# Patient Record
Sex: Female | Born: 1975 | Race: White | Hispanic: No | Marital: Single | Smoking: Never smoker
Health system: Southern US, Community
[De-identification: ages and names within clinical notes are randomized; demographics above are authoritative.]

## PROBLEM LIST (undated history)

## (undated) DIAGNOSIS — M5430 Sciatica, unspecified side: Secondary | ICD-10-CM

## (undated) DIAGNOSIS — Q828 Other specified congenital malformations of skin: Secondary | ICD-10-CM

## (undated) DIAGNOSIS — K589 Irritable bowel syndrome without diarrhea: Secondary | ICD-10-CM

## (undated) DIAGNOSIS — N760 Acute vaginitis: Secondary | ICD-10-CM

## (undated) DIAGNOSIS — F419 Anxiety disorder, unspecified: Secondary | ICD-10-CM

## (undated) DIAGNOSIS — F411 Generalized anxiety disorder: Secondary | ICD-10-CM

## (undated) DIAGNOSIS — Z90710 Acquired absence of both cervix and uterus: Secondary | ICD-10-CM

## (undated) DIAGNOSIS — E063 Autoimmune thyroiditis: Secondary | ICD-10-CM

## (undated) DIAGNOSIS — R0789 Other chest pain: Secondary | ICD-10-CM

## (undated) DIAGNOSIS — R1031 Right lower quadrant pain: Secondary | ICD-10-CM

## (undated) DIAGNOSIS — J42 Unspecified chronic bronchitis: Secondary | ICD-10-CM

## (undated) DIAGNOSIS — J329 Chronic sinusitis, unspecified: Secondary | ICD-10-CM

## (undated) DIAGNOSIS — Z8719 Personal history of other diseases of the digestive system: Secondary | ICD-10-CM

## (undated) HISTORY — DX: Sciatica, unspecified side: M54.30

## (undated) HISTORY — DX: Chronic sinusitis, unspecified: J32.9

## (undated) HISTORY — DX: Right lower quadrant pain: R10.31

## (undated) HISTORY — DX: Other chest pain: R07.89

## (undated) HISTORY — DX: Autoimmune thyroiditis: E06.3

## (undated) HISTORY — DX: Other specified congenital malformations of skin: Q82.8

## (undated) HISTORY — DX: Anxiety disorder, unspecified: F41.9

## (undated) HISTORY — DX: Unspecified chronic bronchitis: J42

## (undated) HISTORY — DX: Irritable bowel syndrome, unspecified: K58.9

## (undated) HISTORY — DX: Acute vaginitis: N76.0

## (undated) HISTORY — PX: WISDOM TOOTH EXTRACTION: SHX21

## (undated) HISTORY — PX: ABDOMINAL HYSTERECTOMY: SHX81

## (undated) HISTORY — DX: Generalized anxiety disorder: F41.1

---

## 2009-04-03 ENCOUNTER — Ambulatory Visit: Payer: Self-pay | Admitting: Internal Medicine

## 2012-04-06 ENCOUNTER — Ambulatory Visit: Payer: Self-pay | Admitting: Emergency Medicine

## 2012-04-09 ENCOUNTER — Ambulatory Visit: Payer: Self-pay

## 2012-04-09 LAB — RAPID STREP-A WITH REFLX: Micro Text Report: NEGATIVE

## 2012-09-14 ENCOUNTER — Ambulatory Visit: Payer: Self-pay

## 2012-10-26 ENCOUNTER — Ambulatory Visit: Payer: Self-pay | Admitting: Internal Medicine

## 2012-11-18 ENCOUNTER — Ambulatory Visit: Payer: Self-pay | Admitting: Family Medicine

## 2013-01-11 ENCOUNTER — Ambulatory Visit: Payer: Self-pay | Admitting: Family Medicine

## 2013-01-13 ENCOUNTER — Encounter: Payer: Self-pay | Admitting: *Deleted

## 2013-02-02 ENCOUNTER — Encounter: Payer: Self-pay | Admitting: Cardiovascular Disease

## 2013-02-02 ENCOUNTER — Ambulatory Visit (INDEPENDENT_AMBULATORY_CARE_PROVIDER_SITE_OTHER): Payer: PRIVATE HEALTH INSURANCE | Admitting: Cardiovascular Disease

## 2013-02-02 VITALS — BP 120/90 | HR 84 | Ht 63.0 in | Wt 138.2 lb

## 2013-02-02 DIAGNOSIS — R9431 Abnormal electrocardiogram [ECG] [EKG]: Secondary | ICD-10-CM

## 2013-02-02 DIAGNOSIS — R079 Chest pain, unspecified: Secondary | ICD-10-CM | POA: Insufficient documentation

## 2013-02-02 NOTE — Assessment & Plan Note (Signed)
The patient's chest pain resolved after the dose of Xanax was increased. Her symptoms are atypical and nonexertional. She has no significant risk factors for coronary artery disease except for an unspecified family history. Her chance of underlying obstructive coronary artery disease is extremely low. Her EKG is unremarkable. I think the etiology of stress testing in this situation is extremely low given that the pretest probability for underlying CAD is overall very low.  Her physical exam is unremarkable and she has no cardiac murmurs or other abnormalities. Thus, I asked her to followup with [place] as needed.

## 2013-02-02 NOTE — Patient Instructions (Addendum)
Follow up as needed

## 2013-02-02 NOTE — Progress Notes (Signed)
HPI  This is a pleasant 37 year old Caucasian female who was referred by Dr. Manuella Ghazi for evaluation of chest pain. She has no previous cardiac history and no significant chronic medical conditions. She is a lifelong nonsmoker. She has a family history of  premature coronary artery disease. In December of last year, she had the flu. In January, she had bronchitis and was treated with antibiotics. She developed substernal chest achiness and pressure feeling at that time. Her symptoms were mostly at rest. She had an EKG chest x-ray and labs. All of them were unremarkable. She also underwent CTA of the chest which showed no evidence of pulmonary embolism. She is known to have anxiety and has been on Xanax. The dose was increased recently for that reason. She reports no chest pain over the last week and she feels back to her normal self. She is able to do all activities without any exertional symptoms. She actually feels better when she exercises.  Allergies  Allergen Reactions  . Bee Venom   . Ciprocinonide (Fluocinolone)   . Vicodin (Hydrocodone-Acetaminophen)      Current Outpatient Prescriptions on File Prior to Visit  Medication Sig Dispense Refill  . ALPRAZolam (XANAX) 0.5 MG tablet Take 0.5 mg by mouth 3 (three) times daily as needed for sleep.      . norethindrone-ethinyl estradiol-iron (MICROGESTIN FE,GILDESS FE,LOESTRIN FE) 1.5-30 MG-MCG tablet Take 1 tablet by mouth daily.       No current facility-administered medications on file prior to visit.     Past Medical History  Diagnosis Date  . Irritable bowel syndrome   . Anxiety state, unspecified   . Vaginitis and vulvovaginitis, unspecified   . Other chest pain   . Other specified congenital anomaly of skin   . Chronic lymphocytic thyroiditis   . Unspecified chronic bronchitis   . Unspecified sinusitis (chronic)   . Sciatica      History reviewed. No pertinent past surgical history.   Family History  Problem Relation Age  of Onset  . Hypertension Father   . Heart disease Father   . Heart attack Paternal Grandmother      History   Social History  . Marital Status: Single    Spouse Name: N/A    Number of Children: N/A  . Years of Education: N/A   Occupational History  . Not on file.   Social History Main Topics  . Smoking status: Never Smoker   . Smokeless tobacco: Not on file  . Alcohol Use: No  . Drug Use: No  . Sexually Active: Not on file   Other Topics Concern  . Not on file   Social History Narrative  . No narrative on file     ROS Constitutional: Negative for fever, chills, diaphoresis, activity change, appetite change and fatigue.  HENT: Negative for hearing loss, nosebleeds, congestion, sore throat, facial swelling, drooling, trouble swallowing, neck pain, voice change, sinus pressure and tinnitus.  Eyes: Negative for photophobia, pain, discharge and visual disturbance.  Respiratory: Negative for apnea, cough and wheezing.  Cardiovascular: Negative for  palpitations and leg swelling.  Gastrointestinal: Negative for nausea, vomiting, abdominal pain, diarrhea, constipation, blood in stool and abdominal distention.  Genitourinary: Negative for dysuria, urgency, frequency, hematuria and decreased urine volume.  Musculoskeletal: Negative for myalgias, back pain, joint swelling, arthralgias and gait problem.  Skin: Negative for color change, pallor, rash and wound.  Neurological: Negative for dizziness, tremors, seizures, syncope, speech difficulty, weakness, light-headedness, numbness and headaches.  Psychiatric/Behavioral: Negative for suicidal ideas, hallucinations, behavioral problems and agitation. The patient is not nervous/anxious.     PHYSICAL EXAM   BP 120/90  Pulse 84  Ht 5' 3"  (1.6 m)  Wt 138 lb 4 oz (62.71 kg)  BMI 24.5 kg/m2 Constitutional: She is oriented to person, place, and time. She appears well-developed and well-nourished. No distress.  HENT: No nasal  discharge.  Head: Normocephalic and atraumatic.  Eyes: Pupils are equal and round. Right eye exhibits no discharge. Left eye exhibits no discharge.  Neck: Normal range of motion. Neck supple. No JVD present. No thyromegaly present.  Cardiovascular: Normal rate, regular rhythm, normal heart sounds. Exam reveals no gallop and no friction rub. No murmur heard.  Pulmonary/Chest: Effort normal and breath sounds normal. No stridor. No respiratory distress. She has no wheezes. She has no rales. She exhibits no tenderness.  Abdominal: Soft. Bowel sounds are normal. She exhibits no distension. There is no tenderness. There is no rebound and no guarding.  Musculoskeletal: Normal range of motion. She exhibits no edema and no tenderness.  Neurological: She is alert and oriented to person, place, and time. Coordination normal.  Skin: Skin is warm and dry. No rash noted. She is not diaphoretic. No erythema. No pallor.  Psychiatric: She has a normal mood and affect. Her behavior is normal. Judgment and thought content normal.     EKG: Normal sinus rhythm with incomplete right bundle branch block which is a normal variant.   ASSESSMENT AND PLAN

## 2014-10-06 LAB — HM PAP SMEAR: HM PAP: NEGATIVE

## 2014-12-26 ENCOUNTER — Ambulatory Visit: Payer: Self-pay | Admitting: Family Medicine

## 2015-08-24 ENCOUNTER — Ambulatory Visit (INDEPENDENT_AMBULATORY_CARE_PROVIDER_SITE_OTHER): Payer: No Typology Code available for payment source | Admitting: Family Medicine

## 2015-08-24 ENCOUNTER — Encounter: Payer: Self-pay | Admitting: Family Medicine

## 2015-08-24 VITALS — BP 114/70 | HR 93 | Temp 98.3°F | Resp 16 | Ht 64.0 in | Wt 142.3 lb

## 2015-08-24 DIAGNOSIS — R1084 Generalized abdominal pain: Secondary | ICD-10-CM

## 2015-08-24 DIAGNOSIS — K589 Irritable bowel syndrome without diarrhea: Secondary | ICD-10-CM | POA: Diagnosis not present

## 2015-08-24 DIAGNOSIS — F411 Generalized anxiety disorder: Secondary | ICD-10-CM

## 2015-08-24 MED ORDER — LINACLOTIDE 290 MCG PO CAPS: 290.0000 ug | ORAL_CAPSULE | Freq: Every day | ORAL | Status: DC

## 2015-08-24 MED ORDER — OMEPRAZOLE 20 MG PO CPDR: 20.0000 mg | DELAYED_RELEASE_CAPSULE | Freq: Every day | ORAL | Status: DC

## 2015-08-24 NOTE — Patient Instructions (Signed)
Irritable Bowel Syndrome, Adult Irritable bowel syndrome (IBS) is not one specific disease. It is a group of symptoms that affects the organs responsible for digestion (gastrointestinal or GI tract).  To regulate how your GI tract works, your body sends signals back and forth between your intestines and your brain. If you have IBS, there may be a problem with these signals. As a result, your GI tract does not function normally. Your intestines may become more sensitive and overreact to certain things. This is especially true when you eat certain foods or when you are under stress.  There are four types of IBS. These may be determined based on the consistency of your stool:   IBS with diarrhea.   IBS with constipation.   Mixed IBS.   Unsubtyped IBS.  It is important to know which type of IBS you have. Some treatments are more likely to be helpful for certain types of IBS.  CAUSES  The exact cause of IBS is not known. RISK FACTORS You may have a higher risk of IBS if:  You are a woman.  You are younger than 41 years old.  You have a family history of IBS.  You have mental health problems.  You have had bacterial infection of your GI tract. SIGNS AND SYMPTOMS  Symptoms of IBS vary from person to person. The main symptom is abdominal pain or discomfort. Additional symptoms usually include one or more of the following:   Diarrhea, constipation, or both.   Abdominal swelling or bloating.   Feeling full or sick after eating a small or regular-size meal.   Frequent gas.   Mucus in the stool.   A feeling of having more stool left after a bowel movement.  Symptoms tend to come and go. They may be associated with stress, psychiatric conditions, or nothing at all.  DIAGNOSIS  There is no specific test to diagnose IBS. Your health care provider will make a diagnosis based on a physical exam, medical history, and your symptoms. You may have other tests to rule out other  conditions that may be causing your symptoms. These may include:   Blood tests.   X-rays.   CT scan.  Endoscopy and colonoscopy. This is a test in which your GI tract is viewed with a long, thin, flexible tube. TREATMENT There is no cure for IBS, but treatment can help relieve symptoms. IBS treatment often includes:   Changes to your diet, such as:  Eating more fiber.  Avoiding foods that cause symptoms.  Drinking more water.  Eating regular, medium-sized portioned meals.  Medicines. These may include:  Fiber supplements if you have constipation.  Medicine to control diarrhea (antidiarrheal medicines).  Medicine to help control muscle spasms in your GI tract (antispasmodic medicines).  Medicines to help with any mental health issues, such as antidepressants or tranquilizers.  Therapy.  Talk therapy may help with anxiety, depression, or other mental health issues that can make IBS symptoms worse.  Stress reduction.  Managing your stress can help keep symptoms under control. HOME CARE INSTRUCTIONS   Take medicines only as directed by your health care provider.  Eat a healthy diet.  Avoid foods and drinks with added sugar.  Include more whole grains, fruits, and vegetables gradually into your diet. This may be especially helpful if you have IBS with constipation.  Avoid any foods and drinks that make your symptoms worse. These may include dairy products and caffeinated or carbonated drinks.  Do not eat large meals.  Drink enough fluid to keep your urine clear or pale yellow.  Exercise regularly. Ask your health care provider for recommendations of good activities for you.  Keep all follow-up visits as directed by your health care provider. This is important. SEEK MEDICAL CARE IF:   You have constant pain.  You have trouble or pain with swallowing.  You have worsening diarrhea. SEEK IMMEDIATE MEDICAL CARE IF:   You have severe and worsening abdominal  pain.   You have diarrhea and:   You have a rash, stiff neck, or severe headache.   You are irritable, sleepy, or difficult to awaken.   You are weak, dizzy, or extremely thirsty.   You have bright red blood in your stool or you have black tarry stools.   You have unusual abdominal swelling that is painful.   You vomit continuously.   You vomit blood (hematemesis).   You have both abdominal pain and a fever.    This information is not intended to replace advice given to you by your health care provider. Make sure you discuss any questions you have with your health care provider.   Document Released: 10/21/2005 Document Revised: 11/11/2014 Document Reviewed: 07/08/2014 Elsevier Interactive Patient Education [employer].

## 2015-08-24 NOTE — Progress Notes (Signed)
Name: Betty Rodgers   MRN: [id number]    DOB: 1976/04/18   Date:08/24/2015       Progress Note  Subjective  Chief Complaint  Chief Complaint  Patient presents with  . Abdominal Pain    for 5 days, belching, bloated, gas    HPI  Irritable bowel syndrome  Patient presents with a five-day history of belching and bloating gas.  Bowel movements have been more frequent than in the past 3 per day. In of regular consistency. No fever chills weight loss and nausea vomiting and melena or hematochezia. It is of note the patient has had increasing stressors in her life including family issues as well as other STRESSORS. THERE IS NO OFFENDING FOOD. THIS BEEN NO FEVER OR CHILLS.  Anxiety  Patient has a long-standing history of anxiety. She's been on alprazolam on a daily basis for a long period of time  Past Medical History  Diagnosis Date  . Irritable bowel syndrome   . Anxiety state, unspecified   . Vaginitis and vulvovaginitis, unspecified   . Other chest pain   . Other specified congenital anomaly of skin   . Chronic lymphocytic thyroiditis   . Unspecified chronic bronchitis ([place])   . Unspecified sinusitis (chronic)   . Sciatica     Social History  Substance Use Topics  . Smoking status: Never Smoker   . Smokeless tobacco: Not on file  . Alcohol Use: No     Current outpatient prescriptions:  .  ALPRAZolam (XANAX) 0.5 MG tablet, Take 0.5 mg by mouth 3 (three) times daily as needed for sleep., Disp: , Rfl:  .  norethindrone-ethinyl estradiol-iron (MICROGESTIN FE,GILDESS FE,LOESTRIN FE) 1.5-30 MG-MCG tablet, Take 1 tablet by mouth daily., Disp: , Rfl:  .  ibuprofen (ADVIL,MOTRIN) 400 MG tablet, Take 400 mg by mouth every 6 (six) hours as needed for pain., Disp: , Rfl:   Allergies  Allergen Reactions  . Bee Venom   . Ciprocinonide [Fluocinolone]   . Vicodin [Hydrocodone-Acetaminophen]     Review of Systems  Constitutional: Negative for fever, chills and weight loss.   HENT: Negative for congestion, hearing loss, sore throat and tinnitus.   Eyes: Negative for blurred vision, double vision and redness.  Respiratory: Negative for cough, hemoptysis and shortness of breath.   Cardiovascular: Negative for chest pain, palpitations, orthopnea, claudication and leg swelling.  Gastrointestinal: Positive for heartburn and abdominal pain. Negative for nausea, vomiting, diarrhea, constipation and blood in stool.       Bowel movements are more frequent than usual but are normal normal in consistency  Genitourinary: Negative for dysuria, urgency, frequency and hematuria.  Musculoskeletal: Negative for myalgias, back pain, joint pain, falls and neck pain.  Skin: Negative for itching.  Neurological: Negative for dizziness, tingling, tremors, focal weakness, seizures, loss of consciousness, weakness and headaches.  Endo/Heme/Allergies: Does not bruise/bleed easily.  Psychiatric/Behavioral: Negative for depression and substance abuse. The patient is not nervous/anxious and does not have insomnia.      Objective  Filed Vitals:   08/24/15 0911  BP: 114/70  Pulse: 93  Temp: 98.3 F (36.8 C)  TempSrc: Oral  Resp: 16  Height: 5' 4" (1.626 m)  Weight: 142 lb 4.8 oz (64.547 kg)  SpO2: 97%     Physical Exam  Constitutional: She is oriented to person, place, and time and well-developed, well-nourished, and in no distress.  HENT:  Head: Normocephalic.  Eyes: EOM are normal. Pupils are equal, round, and reactive to light.  Neck:  Normal range of motion. No thyromegaly present.  Cardiovascular: Normal rate, regular rhythm and normal heart sounds.   No murmur heard. Pulmonary/Chest: Effort normal and breath sounds normal.  Abdominal: Soft.  Bowel sounds are hyperactive. There is mild tenderness to palpation diffusely. No masses are palpable. There is no rebound tenderness.  Musculoskeletal: Normal range of motion. She exhibits no edema.  Neurological: She is alert and  oriented to person, place, and time. No cranial nerve deficit. Gait normal.  Skin: Skin is warm and dry. No rash noted.  Psychiatric: Memory and affect normal.      Assessment & Plan  1. Generalized abdominal pain Probably secondary to her IBS. Cannot completely rule out inflammatory bowel disease. Therefore his CBC sedimentation rate follow with the primary care and - CBC - Sed Rate (ESR) - omeprazole (PRILOSEC) 20 MG capsule; Take 1 capsule (20 mg total) by mouth daily.  Dispense: 30 capsule; Refill: 3  2. IBS (irritable bowel syndrome) Handout given on IBS consideration may be given to [facility]  3. Generalized anxiety disorder New alprazolam and to return to  her primary care

## 2015-08-25 LAB — CBC
Hematocrit: 42.1 % (ref 34.0–46.6)
Hemoglobin: 13.9 g/dL (ref 11.1–15.9)
MCH: 30.3 pg (ref 26.6–33.0)
MCHC: 33 g/dL (ref 31.5–35.7)
MCV: 92 fL (ref 79–97)
NRBC: 0 % (ref 0–0)
PLATELETS: 352 10*3/uL (ref 150–379)
RBC: 4.58 x10E6/uL (ref 3.77–5.28)
RDW: 12.5 % (ref 12.3–15.4)
WBC: 14.9 10*3/uL — AB (ref 3.4–10.8)

## 2015-08-25 LAB — SEDIMENTATION RATE: SED RATE: 5 mm/h (ref 0–32)

## 2015-09-06 ENCOUNTER — Ambulatory Visit: Payer: No Typology Code available for payment source | Admitting: Family Medicine

## 2015-09-12 ENCOUNTER — Other Ambulatory Visit: Payer: Self-pay

## 2015-09-12 NOTE — Telephone Encounter (Signed)
Pt called last week she said and placed a message about getting her alprazlam refilled. She had one refill but it had expired. Said that the pharm has sent 2 request also. PT HAS APPT FOR NOV 23RD

## 2015-09-12 NOTE — Telephone Encounter (Signed)
Routed to Dr. Manuella Ghazi for approval

## 2015-09-13 ENCOUNTER — Telehealth: Payer: Self-pay

## 2015-09-13 NOTE — Telephone Encounter (Signed)
Routed to Dr. Manuella Ghazi for return call

## 2015-09-13 NOTE — Telephone Encounter (Signed)
Patient states Dr. Manuella Ghazi called her and left voicemail to call back for information about her alprazolam refill.  I did not see anything documented, but she states she has called multiple times?

## 2015-09-13 NOTE — Telephone Encounter (Signed)
Returned call and left a voice message. Please schedule patient for an appointment for medication refills.

## 2015-09-27 ENCOUNTER — Encounter: Payer: Self-pay | Admitting: Family Medicine

## 2015-09-27 ENCOUNTER — Ambulatory Visit (INDEPENDENT_AMBULATORY_CARE_PROVIDER_SITE_OTHER): Payer: No Typology Code available for payment source | Admitting: Family Medicine

## 2015-09-27 VITALS — BP 112/62 | HR 120 | Temp 98.5°F | Resp 18 | Ht 64.0 in | Wt 144.5 lb

## 2015-09-27 DIAGNOSIS — F411 Generalized anxiety disorder: Secondary | ICD-10-CM | POA: Insufficient documentation

## 2015-09-27 DIAGNOSIS — F419 Anxiety disorder, unspecified: Secondary | ICD-10-CM | POA: Insufficient documentation

## 2015-09-27 HISTORY — DX: Anxiety disorder, unspecified: F41.9

## 2015-09-27 MED ORDER — ALPRAZOLAM 1 MG PO TABS: 1.0000 mg | ORAL_TABLET | Freq: Three times a day (TID) | ORAL | Status: DC | PRN

## 2015-09-27 NOTE — Progress Notes (Signed)
Name: Betty Rodgers   MRN: [id number]    DOB: [birth year]   Date:09/27/2015       Progress Note  Subjective  Chief Complaint  Chief Complaint  Patient presents with  . Medication Refill    xanax 0.7m    Anxiety Presents for follow-up visit. The problem has been unchanged. Patient reports no chest pain, excessive worry, insomnia, nervous/anxious behavior, palpitations or shortness of breath. The severity of symptoms is moderate.   Past treatments include benzodiazephines. The treatment provided significant relief. Compliance with prior treatments has been good.    Past Medical History  Diagnosis Date  . Irritable bowel syndrome   . Anxiety state, unspecified   . Vaginitis and vulvovaginitis, unspecified   . Other chest pain   . Other specified congenital anomaly of skin   . Chronic lymphocytic thyroiditis   . [place]   . Unspecified sinusitis (chronic)   . Sciatica     History reviewed. No pertinent past surgical history.  Family History  Problem Relation Age of Onset  . Hypertension Father   . Heart disease Father   . Heart attack Paternal Grandmother     Social History   Social History  . Marital Status: Single    Spouse Name: N/A  . Number of Children: N/A  . Years of Education: N/A   Occupational History  . Not on file.   Social History Main Topics  . Smoking status: Never Smoker   . Smokeless tobacco: Not on file  . Alcohol Use: No  . Drug Use: No  . Sexual Activity: Not on file   Other Topics Concern  . Not on file   Social History Narrative     Current outpatient prescriptions:  .  ALPRAZolam (XANAX) 0.5 MG tablet, Take 0.5 mg by mouth 3 (three) times daily as needed for sleep., Disp: , Rfl:  .  ibuprofen (ADVIL,MOTRIN) 400 MG tablet, Take 400 mg by mouth every 6 (six) hours as needed for pain., Disp: , Rfl:  .  Linaclotide (LINZESS) 290 MCG CAPS capsule, Take 1 capsule (290 mcg total) by mouth daily., Disp:  30 capsule, Rfl: 1 .  norethindrone-ethinyl estradiol-iron (MICROGESTIN FE,GILDESS FE,LOESTRIN FE) 1.5-30 MG-MCG tablet, Take 1 tablet by mouth daily., Disp: , Rfl:   Allergies  Allergen Reactions  . Bee Venom   . Ciprocinonide [Fluocinolone]   . Vicodin [Hydrocodone-Acetaminophen]      Review of Systems  Respiratory: Negative for shortness of breath.   Cardiovascular: Negative for chest pain and palpitations.  Psychiatric/Behavioral: Negative for depression. The patient is not nervous/anxious and does not have insomnia.      Objective  Filed Vitals:   09/27/15 1424  BP: 112/62  Pulse: 120  Temp: 98.5 F (36.9 C)  TempSrc: Oral  Resp: 18  Height: 5' 4"  (1.626 m)  Weight: 144 lb 8 oz (65.545 kg)  SpO2: 98%    Physical Exam  Constitutional: She is oriented to person, place, and time and well-developed, well-nourished, and in no distress.  Cardiovascular: Normal rate, regular rhythm and normal heart sounds.   Pulmonary/Chest: Effort normal and breath sounds normal. She has no wheezes.  Neurological: She is alert and oriented to person, place, and time.  Psychiatric: Mood, memory, affect and judgment normal.  Nursing note and vitals reviewed.   Assessment & Plan  1. Generalized anxiety disorder  Stable on present therapy. Taking medication as needed and as directed and is aware of the dependence potential, side  effects and drug interactions of benzodiazepines. Refills provided and follow-up in 6 months.  - ALPRAZolam (XANAX) 1 MG tablet; Take 1 tablet (1 mg total) by mouth 3 (three) times daily as needed for anxiety.  Dispense: 90 tablet; Refill: 5   Betty Rodgers [facility] 09/27/2015 2:56 PM

## 2015-11-24 ENCOUNTER — Encounter: Payer: Self-pay | Admitting: Family Medicine

## 2016-03-25 ENCOUNTER — Ambulatory Visit (INDEPENDENT_AMBULATORY_CARE_PROVIDER_SITE_OTHER): Payer: Managed Care, Other (non HMO) | Admitting: Family Medicine

## 2016-03-25 ENCOUNTER — Ambulatory Visit
Admission: RE | Admit: 2016-03-25 | Discharge: 2016-03-25 | Disposition: A | Discharge status: Home / Self Care | Payer: Managed Care, Other (non HMO) | Source: Ambulatory Visit | Attending: Family Medicine | Admitting: Family Medicine

## 2016-03-25 ENCOUNTER — Encounter: Payer: Self-pay | Admitting: Family Medicine

## 2016-03-25 VITALS — BP 114/60 | HR 106 | Temp 99.7°F | Resp 18 | Ht 64.0 in | Wt 148.3 lb

## 2016-03-25 DIAGNOSIS — F411 Generalized anxiety disorder: Secondary | ICD-10-CM

## 2016-03-25 DIAGNOSIS — R1031 Right lower quadrant pain: Secondary | ICD-10-CM | POA: Insufficient documentation

## 2016-03-25 DIAGNOSIS — R935 Abnormal findings on diagnostic imaging of other abdominal regions, including retroperitoneum: Secondary | ICD-10-CM | POA: Insufficient documentation

## 2016-03-25 HISTORY — DX: Right lower quadrant pain: R10.31

## 2016-03-25 MED ORDER — IOPAMIDOL (ISOVUE-300) INJECTION 61%
100.0000 mL | Freq: Once | INTRAVENOUS | Status: AC | PRN
  Administered 2016-03-25: 100 mL via INTRAVENOUS

## 2016-03-25 MED ORDER — ALPRAZOLAM 1 MG PO TABS: 1.0000 mg | ORAL_TABLET | Freq: Three times a day (TID) | ORAL | Status: DC | PRN

## 2016-03-25 NOTE — Progress Notes (Signed)
Name: Betty Rodgers   MRN: [id number]    DOB: [birth year]   Date:03/25/2016       Progress Note  Subjective  Chief Complaint  Chief Complaint  Patient presents with  . Abdominal Pain    Abdominal Pain This is a new problem. The current episode started 1 to 4 weeks ago (2 weeks). The problem has been unchanged. The pain is located in the suprapubic region, LLQ and RLQ (Sharp pain in the RLQ last night). The quality of the pain is cramping. Associated symptoms include belching, diarrhea (3-4 BM/day) and flatus. Pertinent negatives include no hematochezia (bright red blood on toilet paper on occasion.), melena, nausea or vomiting. Her past medical history is significant for irritable bowel syndrome (has been diagnosed with IBS).  Anxiety Presents for follow-up visit. The problem has been unchanged. Symptoms include excessive worry, irritability and nervous/anxious behavior. Patient reports no nausea. The severity of symptoms is moderate and causing significant distress.   Her past medical history is significant for anxiety/panic attacks. Past treatments include benzodiazephines. The treatment provided significant relief. Compliance with prior treatments has been good.     Past Medical History  Diagnosis Date  . Irritable bowel syndrome   . Anxiety state, unspecified   . Vaginitis and vulvovaginitis, unspecified   . Other chest pain   . Other specified congenital anomaly of skin   . Chronic lymphocytic thyroiditis   . Unspecified chronic bronchitis ([place])   . Unspecified sinusitis (chronic)   . Sciatica     History reviewed. No pertinent past surgical history.  Family History  Problem Relation Age of Onset  . Hypertension Father   . Heart disease Father   . Heart attack Paternal Grandmother     Social History   Social History  . Marital Status: Single    Spouse Name: N/A  . Number of Children: N/A  . Years of Education: N/A   Occupational History  . Not on file.    Social History Main Topics  . Smoking status: Never Smoker   . Smokeless tobacco: Not on file  . Alcohol Use: No  . Drug Use: No  . Sexual Activity: Not on file   Other Topics Concern  . Not on file   Social History Narrative     Current outpatient prescriptions:  .  ALPRAZolam (XANAX) 1 MG tablet, Take 1 tablet (1 mg total) by mouth 3 (three) times daily as needed for anxiety., Disp: 90 tablet, Rfl: 5 .  ibuprofen (ADVIL,MOTRIN) 400 MG tablet, Take 400 mg by mouth every 6 (six) hours as needed for pain., Disp: , Rfl:  .  Linaclotide (LINZESS) 290 MCG CAPS capsule, Take 1 capsule (290 mcg total) by mouth daily., Disp: 30 capsule, Rfl: 1 .  norethindrone-ethinyl estradiol-iron (MICROGESTIN FE,GILDESS FE,LOESTRIN FE) 1.5-30 MG-MCG tablet, Take 1 tablet by mouth daily., Disp: , Rfl:   Allergies  Allergen Reactions  . Bee Venom   . Ciprocinonide [Fluocinolone]   . Vicodin [Hydrocodone-Acetaminophen]      Review of Systems  Constitutional: Positive for irritability.  Gastrointestinal: Positive for abdominal pain, diarrhea (3-4 BM/day) and flatus. Negative for nausea, vomiting, melena and hematochezia (bright red blood on toilet paper on occasion.).  Psychiatric/Behavioral: The patient is nervous/anxious.     Objective  Filed Vitals:   03/25/16 0841  BP: 114/60  Pulse: 106  Temp: 99.7 F (37.6 C)  TempSrc: Oral  Resp: 18  Height: 5' 4"  (1.626 m)  Weight: 148 lb 4.8 oz (67.268  kg)  SpO2: 96%    Physical Exam  Constitutional: She is oriented to person, place, and time and well-developed, well-nourished, and in no distress.  HENT:  Head: Normocephalic and atraumatic.  Cardiovascular: Normal rate and regular rhythm.   Pulmonary/Chest: Effort normal and breath sounds normal.  Abdominal: Soft. Bowel sounds are normal. There is tenderness (supra-pubic area, lower abdominal area.).  Neurological: She is alert and oriented to person, place, and time.  Psychiatric: Mood,  memory, affect and judgment normal.  Nursing note and vitals reviewed.    Assessment & Plan  1. Acute abdominal pain in right lower quadrant IBS flareup versus IBD, obtain CT abdomen and pelvis with contrast to rule out potential etiologies. Referral to gastroenterology for consideration of colonoscopy after obtaining CT scan - Ambulatory referral to Gastroenterology - CBC with Differential - Comprehensive Metabolic Panel (CMET) - Urinalysis, Routine w reflex microscopic - CT Abdomen Pelvis W Contrast; Future  2. Generalized anxiety disorder Stable, taking Alprazolam 1 mg 3 times daily as needed. - ALPRAZolam (XANAX) 1 MG tablet; Take 1 tablet (1 mg total) by mouth 3 (three) times daily as needed for anxiety.  Dispense: 90 tablet; Refill: 5   Betty Rodgers [facility] 03/25/2016 8:51 AM

## 2016-03-26 ENCOUNTER — Telehealth: Payer: Self-pay | Admitting: Gastroenterology

## 2016-03-26 ENCOUNTER — Telehealth: Payer: Self-pay | Admitting: Family Medicine

## 2016-03-26 LAB — CBC WITH DIFFERENTIAL/PLATELET
BASOS: 0 %
Basophils Absolute: 0 10*3/uL (ref 0.0–0.2)
EOS (ABSOLUTE): 0.1 10*3/uL (ref 0.0–0.4)
Eos: 1 %
Hematocrit: 42.5 % (ref 34.0–46.6)
Hemoglobin: 13.7 g/dL (ref 11.1–15.9)
IMMATURE GRANULOCYTES: 0 %
Immature Grans (Abs): 0 10*3/uL (ref 0.0–0.1)
Lymphocytes Absolute: 2.2 10*3/uL (ref 0.7–3.1)
Lymphs: 21 %
MCH: 29.9 pg (ref 26.6–33.0)
MCHC: 32.2 g/dL (ref 31.5–35.7)
MCV: 93 fL (ref 79–97)
MONOS ABS: 0.4 10*3/uL (ref 0.1–0.9)
Monocytes: 4 %
NEUTROS ABS: 7.3 10*3/uL — AB (ref 1.4–7.0)
NEUTROS PCT: 74 %
PLATELETS: 353 10*3/uL (ref 150–379)
RBC: 4.58 x10E6/uL (ref 3.77–5.28)
RDW: 12.9 % (ref 12.3–15.4)
WBC: 10.1 10*3/uL (ref 3.4–10.8)

## 2016-03-26 LAB — COMPREHENSIVE METABOLIC PANEL
A/G RATIO: 1.6 (ref 1.2–2.2)
ALK PHOS: 69 IU/L (ref 39–117)
ALT: 15 IU/L (ref 0–32)
AST: 23 IU/L (ref 0–40)
Albumin: 4.2 g/dL (ref 3.5–5.5)
BILIRUBIN TOTAL: 0.6 mg/dL (ref 0.0–1.2)
BUN/Creatinine Ratio: 17 (ref 9–23)
BUN: 11 mg/dL (ref 6–24)
CHLORIDE: 96 mmol/L (ref 96–106)
CO2: 20 mmol/L (ref 18–29)
Calcium: 9 mg/dL (ref 8.7–10.2)
Creatinine, Ser: 0.64 mg/dL (ref 0.57–1.00)
GFR calc Af Amer: 128 mL/min/{1.73_m2} (ref >59)
GFR calc non Af Amer: 111 mL/min/{1.73_m2} (ref >59)
Globulin, Total: 2.6 g/dL (ref 1.5–4.5)
Glucose: 90 mg/dL (ref 65–99)
POTASSIUM: 3.9 mmol/L (ref 3.5–5.2)
Sodium: 137 mmol/L (ref 134–144)
Total Protein: 6.8 g/dL (ref 6.0–8.5)

## 2016-03-26 LAB — MICROSCOPIC EXAMINATION: Casts: NONE SEEN /lpf

## 2016-03-26 LAB — URINALYSIS, ROUTINE W REFLEX MICROSCOPIC
BILIRUBIN UA: NEGATIVE
GLUCOSE, UA: NEGATIVE
Ketones, UA: NEGATIVE
LEUKOCYTES UA: NEGATIVE
Nitrite, UA: NEGATIVE
PROTEIN UA: NEGATIVE
Specific Gravity, UA: 1.02 (ref 1.005–1.030)
UUROB: 0.2 mg/dL (ref 0.2–1.0)
pH, UA: 6 (ref 5.0–7.5)

## 2016-03-26 NOTE — Telephone Encounter (Signed)
Patient would like a call back for someone to go over her lab results from 03/25/16

## 2016-03-26 NOTE — Telephone Encounter (Signed)
Colonoscopy. Referral is in EPIC but was sent under GI

## 2016-03-27 NOTE — Telephone Encounter (Signed)
Lab results have been reported to patient

## 2016-04-12 NOTE — Telephone Encounter (Signed)
Pt scheduled for an ov with Wohl on 05/02/16.

## 2016-04-12 NOTE — Telephone Encounter (Signed)
Left message for pt to schedule an office visit. Pt is 39 and this was a referral for Abdominal pain.

## 2016-04-15 ENCOUNTER — Telehealth: Payer: Self-pay

## 2016-04-15 NOTE — Telephone Encounter (Signed)
Called patient at this time. Generalized Abdominal Pain x 1 month. Stomach making gurgling sounds often, bloating, with constipation and diarrhea. Denies fever. Appetite has been ok. Denies bleeding.  Patient has appointment scheduled on 6/29. Checked for cancellations and patient will call if anything changes.

## 2016-05-02 ENCOUNTER — Encounter: Payer: Self-pay | Admitting: Gastroenterology

## 2016-05-02 ENCOUNTER — Other Ambulatory Visit: Payer: Self-pay

## 2016-05-02 ENCOUNTER — Ambulatory Visit (INDEPENDENT_AMBULATORY_CARE_PROVIDER_SITE_OTHER): Payer: Managed Care, Other (non HMO) | Admitting: Gastroenterology

## 2016-05-02 VITALS — BP 147/93 | HR 81 | Temp 99.8°F | Ht 64.0 in | Wt 141.0 lb

## 2016-05-02 DIAGNOSIS — K580 Irritable bowel syndrome with diarrhea: Secondary | ICD-10-CM

## 2016-05-02 DIAGNOSIS — R933 Abnormal findings on diagnostic imaging of other parts of digestive tract: Secondary | ICD-10-CM

## 2016-05-02 MED ORDER — PEG 3350-KCL-NA BICARB-NACL 420 G PO SOLR: 4000.0000 mL | Freq: Once | ORAL | Status: DC

## 2016-05-02 NOTE — Progress Notes (Signed)
Gastroenterology Consultation  Referring Provider:     Roselee Nova, MD Primary Care Physician:  Keith Rake, MD Primary Gastroenterologist:  Dr. Allen Norris     Reason for Consultation:     Abnormal CT scan        HPI:   Betty Rodgers is a 40 y.o. y/o female referred for consultation & management of Abnormal CT scan by Dr. Keith Rake, MD.  This patient comes today after being told she had irritable bowel syndrome for some time.  The patient now reports that she has attacks of diarrhea with the last one being about a week ago.  He states that the diarrhea is perfused with multiple bowel movements during the day and occasional bowel movements at night.  He reports that during the last episode she has lost approximate 4 pounds.  There is some blood in the stools but mostly she believes hemorrhoidal bleeding.  There is no report of any family history of colon cancer colon polyps.  The patient had a CT scan of the abdomen that showed possible thickening of the terminal ileum.  There is no report of any fevers or chills.  The patient also reports that her symptoms are worse when she is under a lot of stress and states that she has stress from her teenage child and some from work.  She also reports that her symptoms are also much worse during her menstrual cycle.  She thinks that  Some of the foods may exacerbate her symptoms also.  Past Medical History  Diagnosis Date  . Irritable bowel syndrome   . Anxiety state, unspecified   . Vaginitis and vulvovaginitis, unspecified   . Other chest pain   . Other specified congenital anomaly of skin   . Chronic lymphocytic thyroiditis   . Unspecified chronic bronchitis ([place])   . Unspecified sinusitis (chronic)   . Sciatica   . Acute abdominal pain in right lower quadrant 03/25/2016  . Anxiety disorder 09/27/2015    No past surgical history on file.  Prior to Admission medications   Medication Sig Start Date End Date Taking? Authorizing Provider    ALPRAZolam Duanne Moron) 1 MG tablet Take 1 tablet (1 mg total) by mouth 3 (three) times daily as needed for anxiety. 03/25/16   Roselee Nova, MD  ibuprofen (ADVIL,MOTRIN) 400 MG tablet Take 400 mg by mouth every 6 (six) hours as needed for pain.    Historical Provider, MD  Linaclotide Rolan Lipa) 290 MCG CAPS capsule Take 1 capsule (290 mcg total) by mouth daily. 08/24/15   Ashok Norris, MD  LOESTRIN 1.5/30, 21, 1.5-30 MG-MCG tablet  04/27/16   Historical Provider, MD  norethindrone-ethinyl estradiol-iron (MICROGESTIN FE,GILDESS FE,LOESTRIN FE) 1.5-30 MG-MCG tablet Take 1 tablet by mouth daily.    Historical Provider, MD  polyethylene glycol-electrolytes (NULYTELY/GOLYTELY) 420 g solution Take 4,000 mLs by mouth once. Drink one 8 oz glass every 20 mins until stools are clear 05/02/16   Lucilla Lame, MD    Family History  Problem Relation Age of Onset  . Hypertension Father   . Heart disease Father   . Heart attack Paternal Grandmother      Social History  Substance Use Topics  . Smoking status: Never Smoker   . Smokeless tobacco: None  . Alcohol Use: No    Allergies as of 05/02/2016 - Review Complete 03/25/2016  Allergen Reaction Noted  . Bee venom  01/13/2013    Review of Systems:    All  systems reviewed and negative except where noted in HPI.   Physical Exam:  BP 147/93 mmHg  Pulse 81  Temp(Src) 99.8 F (37.7 C) (Oral)  Ht 5' 4"  (1.626 m)  Wt 141 lb (63.957 kg)  BMI 24.19 kg/m2 No LMP recorded. Psych:  Alert and cooperative. Normal mood and affect. General:   Alert,  Well-developed, well-nourished, pleasant and cooperative in NAD Head:  Normocephalic and atraumatic. Eyes:  Sclera clear, no icterus.   Conjunctiva pink. Ears:  Normal auditory acuity. Nose:  No deformity, discharge, or lesions. Mouth:  No deformity or lesions,oropharynx pink & moist. Neck:  Supple; no masses or thyromegaly. Lungs:  Respirations even and unlabored.  Clear throughout to auscultation.   No  wheezes, crackles, or rhonchi. No acute distress. Heart:  Regular rate and rhythm; no murmurs, clicks, rubs, or gallops. Abdomen:  Normal bowel sounds.  No bruits.  Soft, non-tender and non-distended without masses, hepatosplenomegaly or hernias noted.  No guarding or rebound tenderness.  Negative Carnett sign.   Rectal:  Deferred.  Msk:  Symmetrical without gross deformities.  Good, equal movement & strength bilaterally. Pulses:  Normal pulses noted. Extremities:  No clubbing or edema.  No cyanosis. Neurologic:  Alert and oriented x3;  grossly normal neurologically. Skin:  Intact without significant lesions or rashes.  No jaundice. Lymph Nodes:  No significant cervical adenopathy. Psych:  Alert and cooperative. Normal mood and affect.  Imaging Studies: No results found.  Assessment and Plan:   Betty Rodgers is a 40 y.o. y/o female who comes in today with a CT scan showing thickening of the terminal ileum.  The patient has symptoms very consistent with irritable bowel syndrome but due to the abnormal CT scan she will be set up for a colonoscopy to rule out inflammatory bowel disease.  I have discussed risks & benefits which include, but are not limited to, bleeding, infection, perforation & drug reaction.  The patient agrees with this plan & written consent will be obtained.  The patient has also been told that if the colonoscopy is normal  Then irritable bowel syndrome is likely the cause of all her symptoms.     Note: This dictation was prepared with Dragon dictation along with smaller phrase technology. Any transcriptional errors that result from this process are unintentional.

## 2016-05-03 ENCOUNTER — Other Ambulatory Visit: Payer: Self-pay

## 2016-05-03 ENCOUNTER — Encounter: Payer: Self-pay | Admitting: *Deleted

## 2016-05-08 NOTE — Discharge Instructions (Signed)

## 2016-05-09 ENCOUNTER — Ambulatory Visit: Payer: Managed Care, Other (non HMO) | Admitting: Anesthesiology

## 2016-05-09 ENCOUNTER — Encounter
Admission: RE | Disposition: A | Discharge status: Home / Self Care | Payer: Self-pay | Source: Ambulatory Visit | Attending: Gastroenterology

## 2016-05-09 ENCOUNTER — Ambulatory Visit
Admission: RE | Admit: 2016-05-09 | Discharge: 2016-05-09 | Disposition: A | Discharge status: Home / Self Care | Payer: Managed Care, Other (non HMO) | Source: Ambulatory Visit | Attending: Gastroenterology | Admitting: Gastroenterology

## 2016-05-09 DIAGNOSIS — R933 Abnormal findings on diagnostic imaging of other parts of digestive tract: Secondary | ICD-10-CM

## 2016-05-09 DIAGNOSIS — K529 Noninfective gastroenteritis and colitis, unspecified: Secondary | ICD-10-CM | POA: Diagnosis not present

## 2016-05-09 DIAGNOSIS — E063 Autoimmune thyroiditis: Secondary | ICD-10-CM | POA: Insufficient documentation

## 2016-05-09 DIAGNOSIS — K589 Irritable bowel syndrome without diarrhea: Secondary | ICD-10-CM | POA: Insufficient documentation

## 2016-05-09 DIAGNOSIS — R198 Other specified symptoms and signs involving the digestive system and abdomen: Secondary | ICD-10-CM | POA: Insufficient documentation

## 2016-05-09 DIAGNOSIS — Z793 Long term (current) use of hormonal contraceptives: Secondary | ICD-10-CM | POA: Insufficient documentation

## 2016-05-09 DIAGNOSIS — Z79899 Other long term (current) drug therapy: Secondary | ICD-10-CM | POA: Insufficient documentation

## 2016-05-09 DIAGNOSIS — Z91030 Bee allergy status: Secondary | ICD-10-CM | POA: Diagnosis not present

## 2016-05-09 DIAGNOSIS — F419 Anxiety disorder, unspecified: Secondary | ICD-10-CM | POA: Diagnosis not present

## 2016-05-09 DIAGNOSIS — Z9889 Other specified postprocedural states: Secondary | ICD-10-CM | POA: Diagnosis not present

## 2016-05-09 DIAGNOSIS — Z8249 Family history of ischemic heart disease and other diseases of the circulatory system: Secondary | ICD-10-CM | POA: Diagnosis not present

## 2016-05-09 DIAGNOSIS — K621 Rectal polyp: Secondary | ICD-10-CM | POA: Diagnosis not present

## 2016-05-09 DIAGNOSIS — K6389 Other specified diseases of intestine: Secondary | ICD-10-CM | POA: Insufficient documentation

## 2016-05-09 DIAGNOSIS — K5730 Diverticulosis of large intestine without perforation or abscess without bleeding: Secondary | ICD-10-CM | POA: Diagnosis not present

## 2016-05-09 DIAGNOSIS — Q829 Congenital malformation of skin, unspecified: Secondary | ICD-10-CM | POA: Diagnosis not present

## 2016-05-09 DIAGNOSIS — D123 Benign neoplasm of transverse colon: Secondary | ICD-10-CM | POA: Insufficient documentation

## 2016-05-09 HISTORY — PX: COLONOSCOPY WITH PROPOFOL: SHX5780

## 2016-05-09 HISTORY — PX: POLYPECTOMY: SHX5525

## 2016-05-09 SURGERY — COLONOSCOPY WITH PROPOFOL
Anesthesia: Monitor Anesthesia Care | Wound class: Contaminated

## 2016-05-09 MED ORDER — ACETAMINOPHEN 325 MG PO TABS: 325.0000 mg | ORAL_TABLET | ORAL | Status: DC | PRN

## 2016-05-09 MED ORDER — PROPOFOL 10 MG/ML IV BOLUS
INTRAVENOUS | Status: DC | PRN
  Administered 2016-05-09: 70 mg via INTRAVENOUS
  Administered 2016-05-09: 30 mg via INTRAVENOUS
  Administered 2016-05-09 (×2): 20 mg via INTRAVENOUS
  Administered 2016-05-09: 30 mg via INTRAVENOUS
  Administered 2016-05-09: 20 mg via INTRAVENOUS
  Administered 2016-05-09: 40 mg via INTRAVENOUS
  Administered 2016-05-09: 30 mg via INTRAVENOUS
  Administered 2016-05-09 (×2): 20 mg via INTRAVENOUS
  Administered 2016-05-09: 30 mg via INTRAVENOUS

## 2016-05-09 MED ORDER — LIDOCAINE HCL (CARDIAC) 20 MG/ML IV SOLN
INTRAVENOUS | Status: DC | PRN
  Administered 2016-05-09: 50 mg via INTRAVENOUS

## 2016-05-09 MED ORDER — ACETAMINOPHEN 160 MG/5ML PO SOLN: 325.0000 mg | ORAL | Status: DC | PRN

## 2016-05-09 MED ORDER — SIMETHICONE 40 MG/0.6ML PO SUSP
ORAL | Status: DC | PRN
  Administered 2016-05-09: 09:00:00

## 2016-05-09 MED ORDER — LACTATED RINGERS IV SOLN
INTRAVENOUS | Status: DC
  Administered 2016-05-09: 08:00:00 via INTRAVENOUS

## 2016-05-09 SURGICAL SUPPLY — 23 items
CANISTER SUCT 1200ML W/VALVE (MISCELLANEOUS) ×4 IMPLANT
CLIP HMST 235XBRD CATH ROT (MISCELLANEOUS) IMPLANT
CLIP RESOLUTION 360 11X235 (MISCELLANEOUS)
FCP ESCP3.2XJMB 240X2.8X (MISCELLANEOUS)
FORCEPS BIOP RAD 4 LRG CAP 4 (CUTTING FORCEPS) ×4 IMPLANT
FORCEPS BIOP RJ4 240 W/NDL (MISCELLANEOUS)
FORCEPS ESCP3.2XJMB 240X2.8X (MISCELLANEOUS) IMPLANT
GOWN CVR UNV OPN BCK APRN NK (MISCELLANEOUS) ×4 IMPLANT
GOWN ISOL THUMB LOOP REG UNIV (MISCELLANEOUS) ×4
INJECTOR VARIJECT VIN23 (MISCELLANEOUS) IMPLANT
KIT DEFENDO VALVE AND CONN (KITS) IMPLANT
KIT ENDO PROCEDURE OLY (KITS) ×4 IMPLANT
MARKER SPOT ENDO TATTOO 5ML (MISCELLANEOUS) IMPLANT
PAD GROUND ADULT SPLIT (MISCELLANEOUS) IMPLANT
PROBE APC STR FIRE (PROBE) IMPLANT
RETRIEVER NET ROTH 2.5X230 LF (MISCELLANEOUS) ×4 IMPLANT
SNARE SHORT THROW 13M SML OVAL (MISCELLANEOUS) IMPLANT
SNARE SHORT THROW 30M LRG OVAL (MISCELLANEOUS) IMPLANT
SNARE SNG USE RND 15MM (INSTRUMENTS) IMPLANT
SPOT EX ENDOSCOPIC TATTOO (MISCELLANEOUS)
TRAP ETRAP POLY (MISCELLANEOUS) IMPLANT
VARIJECT INJECTOR VIN23 (MISCELLANEOUS)
WATER STERILE IRR 250ML POUR (IV SOLUTION) ×4 IMPLANT

## 2016-05-09 NOTE — Transfer of Care (Signed)
Immediate Anesthesia Transfer of Care Note  Patient: Betty Rodgers  Procedure(s) Performed: Procedure(s) with comments: COLONOSCOPY WITH PROPOFOL (N/A) - PLEASE LEAVE EARLY AS POSSIBLE POLYPECTOMY  Patient Location: PACU  Anesthesia Type: MAC  Level of Consciousness: awake, alert  and patient cooperative  Airway and Oxygen Therapy: Patient Spontanous Breathing and Patient connected to supplemental oxygen  Post-op Assessment: Post-op Vital signs reviewed, Patient's Cardiovascular Status Stable, Respiratory Function Stable, Patent Airway and No signs of Nausea or vomiting  Post-op Vital Signs: Reviewed and stable  Complications: No apparent anesthesia complications

## 2016-05-09 NOTE — Anesthesia Preprocedure Evaluation (Signed)
Anesthesia Evaluation  Patient identified by MRN, date of birth, ID band  Reviewed: Allergy & Precautions, H&P , NPO status , Patient's Chart, lab work & pertinent test results  Airway Mallampati: III  TM Distance: >3 FB Neck ROM: full    Dental no notable dental hx.    Pulmonary    Pulmonary exam normal        Cardiovascular  Rhythm:regular Rate:Normal     Neuro/Psych PSYCHIATRIC DISORDERS    GI/Hepatic   Endo/Other    Renal/GU      Musculoskeletal   Abdominal   Peds  Hematology   Anesthesia Other Findings   Reproductive/Obstetrics                             Anesthesia Physical Anesthesia Plan  ASA: II  Anesthesia Plan: MAC   Post-op Pain Management:    Induction:   Airway Management Planned:   Additional Equipment:   Intra-op Plan:   Post-operative Plan:   Informed Consent: I have reviewed the patients History and Physical, chart, labs and discussed the procedure including the risks, benefits and alternatives for the proposed anesthesia with the patient or authorized representative who has indicated his/her understanding and acceptance.     Plan Discussed with: CRNA  Anesthesia Plan Comments:         Anesthesia Quick Evaluation

## 2016-05-09 NOTE — Anesthesia Procedure Notes (Signed)
Procedure Name: MAC Performed by: Danny Yackley Pre-anesthesia Checklist: Patient identified, Emergency Drugs available, Suction available, Timeout performed and Patient being monitored Patient Re-evaluated:Patient Re-evaluated prior to inductionOxygen Delivery Method: Nasal cannula Placement Confirmation: positive ETCO2       

## 2016-05-09 NOTE — H&P (Signed)
Lucilla Lame, MD [facility] [address] [place], [place] [address] Phone: [phone] Fax : [phone]  Primary Care Physician:  Keith Rake, MD Primary Gastroenterologist:  Dr. Allen Norris  Pre-Procedure History & Physical: HPI:  Betty Rodgers is a 40 y.o. female is here for an colonoscopy.   Past Medical History  Diagnosis Date  . Irritable bowel syndrome   . Anxiety state, unspecified   . Vaginitis and vulvovaginitis, unspecified   . Other chest pain   . Other specified congenital anomaly of skin   . Chronic lymphocytic thyroiditis   . Unspecified chronic bronchitis ([place])   . Unspecified sinusitis (chronic)   . Sciatica   . Acute abdominal pain in right lower quadrant 03/25/2016  . Anxiety disorder 09/27/2015    Past Surgical History  Procedure Laterality Date  . Wisdom tooth extraction      Prior to Admission medications   Medication Sig Start Date End Date Taking? Authorizing Provider  ALPRAZolam Duanne Moron) 1 MG tablet Take 1 tablet (1 mg total) by mouth 3 (three) times daily as needed for anxiety. 03/25/16  Yes Roselee Nova, MD  Ascorbic Acid (VITAMIN C PO) Take by mouth.   Yes Historical Provider, MD  B Complex Vitamins (VITAMIN B-COMPLEX PO) Take by mouth.   Yes Historical Provider, MD  Cholecalciferol (VITAMIN D PO) Take by mouth.   Yes Historical Provider, MD  LOESTRIN 1.5/30, 21, 1.5-30 MG-MCG tablet  04/27/16  Yes Historical Provider, MD  Omega-3 Fatty Acids (OMEGA 3 PO) Take by mouth.   Yes Historical Provider, MD  norethindrone-ethinyl estradiol-iron (MICROGESTIN FE,GILDESS FE,LOESTRIN FE) 1.5-30 MG-MCG tablet Take 1 tablet by mouth daily. Reported on 05/09/2016    Historical Provider, MD  polyethylene glycol-electrolytes (NULYTELY/GOLYTELY) 420 g solution Take 4,000 mLs by mouth once. Drink one 8 oz glass every 20 mins until stools are clear 05/02/16   Lucilla Lame, MD    Allergies as of 05/03/2016 - Review Complete 05/03/2016  Allergen Reaction Noted  . Bee  venom  01/13/2013    Family History  Problem Relation Age of Onset  . Hypertension Father   . Heart disease Father   . Heart attack Paternal Grandmother     Social History   Social History  . Marital Status: Single    Spouse Name: N/A  . Number of Children: N/A  . Years of Education: N/A   Occupational History  . Not on file.   Social History Main Topics  . Smoking status: Never Smoker   . Smokeless tobacco: Not on file  . Alcohol Use: 4.2 oz/week    0 Standard drinks or equivalent, 7 Shots of liquor per week  . Drug Use: No  . Sexual Activity: Not on file   Other Topics Concern  . Not on file   Social History Narrative    Review of Systems: See HPI, otherwise negative ROS  Physical Exam: BP 139/98 mmHg  Pulse 99  Temp(Src) 98.1 F (36.7 C) (Temporal)  Ht 5' 4"  (1.626 m)  Wt 139 lb 8 oz (63.277 kg)  BMI 23.93 kg/m2  SpO2 99%  LMP 04/24/2016 (Exact Date) General:   Alert,  pleasant and cooperative in NAD Head:  Normocephalic and atraumatic. Neck:  Supple; no masses or thyromegaly. Lungs:  Clear throughout to auscultation.    Heart:  Regular rate and rhythm. Abdomen:  Soft, nontender and nondistended. Normal bowel sounds, without guarding, and without rebound.   Neurologic:  Alert and  oriented x4;  grossly  normal neurologically.  Impression/Plan: Betty Rodgers is here for an colonoscopy to be performed for abnormal Ct   Risks, benefits, limitations, and alternatives regarding  colonoscopy have been reviewed with the patient.  Questions have been answered.  All parties agreeable.   Lucilla Lame, MD  05/09/2016, 8:48 AM

## 2016-05-09 NOTE — Anesthesia Postprocedure Evaluation (Signed)
Anesthesia Post Note  Patient: Leveda Kendrix  Procedure(s) Performed: Procedure(s) (LRB): COLONOSCOPY WITH PROPOFOL (N/A) POLYPECTOMY  Patient location during evaluation: PACU Anesthesia Type: MAC Level of consciousness: awake and alert and oriented Pain management: satisfactory to patient Vital Signs Assessment: post-procedure vital signs reviewed and stable Respiratory status: spontaneous breathing, nonlabored ventilation and respiratory function stable Cardiovascular status: blood pressure returned to baseline and stable Postop Assessment: Adequate PO intake and No signs of nausea or vomiting Anesthetic complications: no    Raliegh Ip

## 2016-05-09 NOTE — Op Note (Signed)
[facility] Patient Name: Betty Rodgers Procedure Date: 05/09/2016 8:49 AM MRN: [id number] Account #: [id number] Date of Birth: [birth year] Admit Type: Outpatient Age: 40 Room: [facility] OR ROOM 01 Gender: Female Note Status: Finalized Procedure:            Colonoscopy Indications:          Abnormal CT of the GI tract Providers:            Lucilla Lame, MD Referring MD:         Otila Back. Manuella Ghazi (Referring MD) Medicines:            Propofol per Anesthesia Complications:        No immediate complications. Procedure:            Pre-Anesthesia Assessment:                       - Prior to the procedure, a History and Physical was                        performed, and patient medications and allergies were                        reviewed. The patient's tolerance of previous                        anesthesia was also reviewed. The risks and benefits of                        the procedure and the sedation options and risks were                        discussed with the patient. All questions were                        answered, and informed consent was obtained. Prior                        Anticoagulants: The patient has taken no previous                        anticoagulant or antiplatelet agents. ASA Grade                        Assessment: II - A patient with mild systemic disease.                        After reviewing the risks and benefits, the patient was                        deemed in satisfactory condition to undergo the                        procedure.                       After obtaining informed consent, the colonoscope was                        passed under direct vision. Throughout the procedure,  the patient's blood pressure, pulse, and oxygen                        saturations were monitored continuously. The Olympus                        CF-HQ190L Colonoscope (S#. [id number]) was introduced                        through  the anus and advanced to the the terminal                        ileum. The colonoscopy was performed without                        difficulty. The patient tolerated the procedure well.                        The quality of the bowel preparation was excellent. Findings:      The perianal and digital rectal examinations were normal.      A localized area of mucosa in the terminal ileum was mildly       erythematous. Biopsies were taken with a cold forceps for histology.      A 4 mm polyp was found in the transverse colon. The polyp was sessile.       The polyp was removed with a cold biopsy forceps. Resection and       retrieval were complete.      A 3 mm polyp was found in the rectum. The polyp was sessile. The polyp       was removed with a cold biopsy forceps. Resection and retrieval were       complete.      Random biopsies were obtained with cold forceps for histology randomly.      Multiple small-mouthed diverticula were found in the sigmoid colon. Impression:           - Erythematous mucosa in the terminal ileum. Biopsied.                       - One 4 mm polyp in the transverse colon, removed with                        a cold biopsy forceps. Resected and retrieved.                       - One 3 mm polyp in the rectum, removed with a cold                        biopsy forceps. Resected and retrieved.                       - Diverticulosis in the sigmoid colon.                       - Random biopsies were obtained. Recommendation:       - Await pathology results. Procedure Code(s):    --- Professional ---                       [phone], Colonoscopy, flexible; with biopsy, single or  multiple Diagnosis Code(s):    --- Professional ---                       R93.3, Abnormal findings on diagnostic imaging of other                        parts of digestive tract                       K63.89, Other specified diseases of intestine                       D12.3, Benign  neoplasm of transverse colon (hepatic                        flexure or splenic flexure)                       K62.1, Rectal polyp CPT copyright 2016 American Medical Association. All rights reserved. The codes documented in this report are preliminary and upon coder review may  be revised to meet current compliance requirements. Lucilla Lame, MD 05/09/2016 9:20:59 AM This report has been signed electronically. Number of Addenda: 0 Note Initiated On: 05/09/2016 8:49 AM Scope Withdrawal Time: 0 hours 6 minutes 58 seconds  Total Procedure Duration: 0 hours 13 minutes 41 seconds       [facility]

## 2016-05-10 ENCOUNTER — Encounter: Payer: Self-pay | Admitting: Gastroenterology

## 2016-05-13 ENCOUNTER — Encounter: Payer: Self-pay | Admitting: Gastroenterology

## 2016-05-16 ENCOUNTER — Telehealth: Payer: Self-pay | Admitting: Gastroenterology

## 2016-05-16 NOTE — Telephone Encounter (Signed)
Pt scheduled for a follow up appt with Dr, Allen Norris on July 20th.

## 2016-05-16 NOTE — Telephone Encounter (Signed)
Please call regarding results

## 2016-05-16 NOTE — Telephone Encounter (Signed)
-----   Message from Lucilla Lame, MD sent at 05/16/2016  1:06 PM EDT ----- Please have the patient come in for a follow up to discuss the pathology. There was no cancerous or precancerous lesions seen.

## 2016-05-23 ENCOUNTER — Ambulatory Visit (INDEPENDENT_AMBULATORY_CARE_PROVIDER_SITE_OTHER): Payer: Managed Care, Other (non HMO) | Admitting: Gastroenterology

## 2016-05-23 ENCOUNTER — Encounter: Payer: Self-pay | Admitting: Gastroenterology

## 2016-05-23 VITALS — BP 138/78 | HR 71 | Temp 99.8°F | Ht 64.0 in | Wt 139.0 lb

## 2016-05-23 DIAGNOSIS — D509 Iron deficiency anemia, unspecified: Secondary | ICD-10-CM

## 2016-05-23 DIAGNOSIS — R197 Diarrhea, unspecified: Secondary | ICD-10-CM | POA: Diagnosis not present

## 2016-05-23 MED ORDER — MESALAMINE ER 500 MG PO CPCR: 500.0000 mg | ORAL_CAPSULE | Freq: Four times a day (QID) | ORAL | Status: DC

## 2016-05-23 NOTE — Progress Notes (Addendum)
Gastroenterology Consultation  Referring Provider:     Roselee Nova, MD Primary Care Physician:  Keith Rake, MD Primary Gastroenterologist:  Dr. Allen Norris     Reason for Consultation:    Follow-up after colonoscopy        HPI:   Betty Rodgers is a 40 y.o. y/o female.  This patient comes today with continued diarrhea. Patient underwent a colonoscopy with biopsies of the terminal ileum which appeared inflamed. 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   Past Medical History  Diagnosis Date  . Irritable bowel syndrome   . Anxiety state, unspecified   . Vaginitis and vulvovaginitis, unspecified   . Other chest pain   . Other specified congenital anomaly of skin   . Chronic lymphocytic thyroiditis   . Unspecified chronic bronchitis ([place])   . Unspecified sinusitis (chronic)   . Sciatica   . Acute abdominal pain in right lower quadrant 03/25/2016  . Anxiety disorder 09/27/2015    Past Surgical History  Procedure Laterality Date  . Wisdom tooth extraction    . Colonoscopy with propofol N/A 05/09/2016    Procedure: COLONOSCOPY WITH PROPOFOL;  Surgeon: Lucilla Lame, MD;  Location: [place];  Service: Endoscopy;  Laterality: N/A;  PLEASE LEAVE EARLY AS POSSIBLE  . Polypectomy  05/09/2016    Procedure: POLYPECTOMY;  Surgeon: Lucilla Lame, MD;  Location: [place];  Service: Endoscopy;;    Prior to Admission medications   Medication Sig Start Date End Date Taking? Authorizing Provider  ALPRAZolam Duanne Moron) 1 MG tablet Take 1 tablet (1 mg total) by mouth 3 (three) times daily as needed for anxiety. 03/25/16  Yes Roselee Nova, MD  Ascorbic Acid (VITAMIN C PO) Take by mouth.   Yes Historical Provider, MD  B Complex Vitamins (VITAMIN B-COMPLEX PO) Take by mouth.   Yes Historical Provider, MD  Omega-3 Fatty Acids (OMEGA 3 PO) Take by mouth.   Yes Historical Provider, MD  Cholecalciferol (VITAMIN D PO) Take by mouth.    Historical Provider, MD    LOESTRIN 1.5/30, 21, 1.5-30 MG-MCG tablet  04/27/16   Historical Provider, MD  mesalamine (PENTASA) 500 MG CR capsule Take 1 capsule (500 mg total) by mouth 4 (four) times daily. 05/23/16   Lucilla Lame, MD    Family History  Problem Relation Age of Onset  . Hypertension Father   . Heart disease Father   . Heart attack Paternal Grandmother      Social History  Substance Use Topics  . Smoking status: Never Smoker   . Smokeless tobacco: Never Used  . Alcohol Use: 4.2 oz/week    7 Shots of liquor, 0 Standard drinks or equivalent per week    Allergies as of 05/23/2016 - Review Complete 05/23/2016  Allergen Reaction Noted  . Bee venom  01/13/2013    Review of Systems:    All systems reviewed and negative except where noted in HPI.   Physical Exam:  BP 138/78 mmHg  Pulse 71  Temp(Src) 99.8 F (37.7 C) (Oral)  Ht 5' 4"  (1.626 m)  Wt 139 lb (63.05 kg)  BMI 23.85 kg/m2  LMP 04/24/2016 (Exact Date) Patient's last menstrual period was 04/24/2016 (exact date). Psych:  Alert and cooperative. Normal mood and affect. General:   Alert,  Well-developed, well-nourished, pleasant and cooperative in NAD Head:  Normocephalic and atraumatic. Eyes:  Sclera clear, no icterus.   Conjunctiva pink. Ears:  Normal auditory acuity. Msk:  Symmetrical without  gross deformities.  Good, equal movement & strength bilaterally. Extremities:  No clubbing or edema.  No cyanosis. Neurologic:  Alert and oriented x3;  grossly normal neurologically. Psych:  Alert and cooperative. Normal mood and affect.  Imaging Studies: No results found.  Assessment and Plan:   Betty Rodgers is a 40 y.o. 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 The patient has been explained that this may represent Crohn's disease. The patient is symptomatic with diarrhea. The patient will be started on Pentasa. She has been told to take this for 8 weeks and then see if her  symptoms return. The patient has been explained the plan and agrees with it.   Note: This dictation was prepared with Dragon dictation along with smaller phrase technology. Any transcriptional errors that result from this process are unintentional.

## 2016-05-23 NOTE — Addendum Note (Signed)
Addended by: Lucilla Lame on: 05/23/2016 02:39 PM   Modules accepted: Level of Service

## 2016-05-29 ENCOUNTER — Ambulatory Visit: Payer: Managed Care, Other (non HMO) | Admitting: Gastroenterology

## 2016-06-18 ENCOUNTER — Ambulatory Visit
Admission: EM | Admit: 2016-06-18 | Discharge: 2016-06-18 | Disposition: A | Discharge status: Home / Self Care | Payer: Managed Care, Other (non HMO)

## 2016-06-18 DIAGNOSIS — H6123 Impacted cerumen, bilateral: Secondary | ICD-10-CM

## 2016-06-18 NOTE — ED Triage Notes (Signed)
Patient complains of wax clogging her ears, she has been trying to loosen and clean them out at home with no success. They have felt impacted for about 3-4 days, she says that her hearing is muffled.

## 2016-06-18 NOTE — ED Provider Notes (Signed)
[facility] ____________________________________________  Time seen: Approximately 0830AM  I have reviewed the triage vital signs and the nursing notes.   HISTORY  Chief Complaint Cerumen Impaction  HPI Betty Rodgers is a 40 y.o. female well-appearing patient. No acute distress. Presents for the complaints of bilateral ear clogged feeling and feeling that the wax is built up. Patient reports she can still hear from both of her ears but the hearing seems muffled. Patient reports history of same in past with cerumen impaction. Patient reports that she has been using over-the-counter Debrox kit for the last 2 days without any relief.  Denies recent sickness. Denies ear pain. Denies ringing in ears. Reports has had a mild nasal congestion and runny nose for a few days but denies sinus pain. Denies headache, dizziness, weakness, fevers, chest pain, shortness of breath, neck pain or back pain. Patient reports she feels well otherwise.  Keith Rake, MD: PCP   Past Medical History:  Diagnosis Date  . Acute abdominal pain in right lower quadrant 03/25/2016  . Anxiety disorder 09/27/2015  . Anxiety state, unspecified   . Chronic lymphocytic thyroiditis   . Irritable bowel syndrome   . Other chest pain   . Other specified congenital anomaly of skin   . Sciatica   . Unspecified chronic bronchitis ([place])   . Unspecified sinusitis (chronic)   . Vaginitis and vulvovaginitis, unspecified     Patient Active Problem List   Diagnosis Date Noted  . Abnormal findings-gastrointestinal tract   . Other specified diseases of intestine   . Benign neoplasm of transverse colon   . Rectal polyp   . Acute abdominal pain in right lower quadrant 03/25/2016  . Anxiety disorder 09/27/2015    Past Surgical History:  Procedure Laterality Date  . COLONOSCOPY WITH PROPOFOL N/A 05/09/2016   Procedure: COLONOSCOPY WITH PROPOFOL;  Surgeon: Lucilla Lame, MD;  Location: [facility];   Service: Endoscopy;  Laterality: N/A;  PLEASE LEAVE EARLY AS POSSIBLE  . POLYPECTOMY  05/09/2016   Procedure: POLYPECTOMY;  Surgeon: Lucilla Lame, MD;  Location: [facility];  Service: Endoscopy;;  . WISDOM TOOTH EXTRACTION     No current facility-administered medications for this encounter.   Current Outpatient Prescriptions:  .  ALPRAZolam (XANAX) 1 MG tablet, Take 1 tablet (1 mg total) by mouth 3 (three) times daily as needed for anxiety., Disp: 90 tablet, Rfl: 5 .  Ascorbic Acid (VITAMIN C PO), Take by mouth., Disp: , Rfl:  .  B Complex Vitamins (VITAMIN B-COMPLEX PO), Take by mouth., Disp: , Rfl:  .  Cholecalciferol (VITAMIN D PO), Take by mouth., Disp: , Rfl:  .  LOESTRIN 1.5/30, 21, 1.5-30 MG-MCG tablet, , Disp: , Rfl:  .  mesalamine (PENTASA) 500 MG CR capsule, Take 1 capsule (500 mg total) by mouth 4 (four) times daily., Disp: 120 capsule, Rfl: 6 .  Omega-3 Fatty Acids (OMEGA 3 PO), Take by mouth., Disp: , Rfl:   Allergies Bee venom  Family History  Problem Relation Age of Onset  . Hypertension Father   . Heart disease Father   . Heart attack Paternal Grandmother     Social History Social History  Substance Use Topics  . Smoking status: Never Smoker  . Smokeless tobacco: Never Used  . Alcohol use 4.2 oz/week    7 Shots of liquor per week    Review of Systems Constitutional: No fever/chills Eyes: No visual changes. ENT: No sore throat.As above. Cardiovascular: Denies chest pain. Respiratory: Denies  shortness of breath. Gastrointestinal: No abdominal pain.  No nausea, no vomiting.  No diarrhea.  No constipation. Genitourinary: Negative for dysuria. Musculoskeletal: Negative for back pain. Skin: Negative for rash. Neurological: Negative for headaches, focal weakness or numbness.  10-point ROS otherwise negative.  ____________________________________________   PHYSICAL EXAM:  VITAL SIGNS: ED Triage Vitals  Enc Vitals Group     BP 06/18/16 0823 134/82      Pulse Rate 06/18/16 0823 90     Resp 06/18/16 0823 18     Temp 06/18/16 0823 98 F (36.7 C)     Temp Source 06/18/16 0823 Oral     SpO2 06/18/16 0823 100 %     Weight 06/18/16 0825 140 lb (63.5 kg)     Height 06/18/16 0825 5' 4"  (1.626 m)     Head Circumference --      Peak Flow --      Pain Score 06/18/16 0823 1     Pain Loc --      Pain Edu? --      Excl. in [place]? --     Constitutional: Alert and oriented. Well appearing and in no acute distress. Eyes: Conjunctivae are normal. PERRL. EOMI. ENT:      Head: Normocephalic and atraumatic.No sinus tenderness to palpation.     Ears: Bilateral ears total cerumen impaction. Post cerumen removal, reexamined- bilateral ears with clear canal, no erythema, normal TMs bilaterally, nontender, no exudate or drainage. No surrounding swelling or erythema bilaterally.      Nose: mild nasal congestion      Mouth/Throat: Mucous membranes are moist.Oropharynx non-erythematous. No tonsillar swelling or exudate.  Neck: No stridor. Supple without meningismus.  Hematological/Lymphatic/Immunilogical: No cervical lymphadenopathy. Cardiovascular: Normal rate, regular rhythm. Grossly normal heart sounds.  Good peripheral circulation. Respiratory: Normal respiratory effort without tachypnea nor retractions. Breath sounds are clear and equal bilaterally. No wheezes/rales/rhonchi.. Gastrointestinal: Soft and nontender.  Musculoskeletal:  Ambulatory with steady gait. No midline cervical, thoracic or lumbar tenderness to palpation. Neurologic:  Normal speech and language. No gross focal neurologic deficits are appreciated. Speech is normal. No gait instability.  Skin:  Skin is warm, dry and intact. No rash noted. Psychiatric: Mood and affect are normal. Speech and behavior are normal. Patient exhibits appropriate insight and judgment   ___________________________________________   LABS (all labs ordered are listed, but only abnormal results are  displayed)  Labs Reviewed - No data to display ____________________________________________ PROCEDURES Procedures  Ceruminosis is noted.  Wax is removed by curette and irrigation. Instructions for home care to prevent wax buildup are given. _______________________________________   INITIAL IMPRESSION / ASSESSMENT AND PLAN / ED COURSE  Pertinent labs & imaging results that were available during my care of the patient were reviewed by me and considered in my medical decision making (see chart for details).  Well-appearing patient. No acute distress. Presents for the complaints of bilateral cerumen impaction. Post curette and irrigation by RN, bilateral cerumen impaction resolved. Patient reports hearing much improved and she feels much better post cerumen removal.   Discussed follow up with Primary care physician this week. Discussed follow up and return parameters including no resolution or any worsening concerns. Patient verbalized understanding and agreed to plan.   ____________________________________________   FINAL CLINICAL IMPRESSION(S) / ED DIAGNOSES  Final diagnoses:  Cerumen impaction, bilateral     Discharge Medication List as of 06/18/2016  9:02 AM      Note: This dictation was prepared with Dragon dictation along with smaller phrase  technology. Any transcriptional errors that result from this process are unintentional.    Clinical Course       Marylene Land, NP 06/18/16 1145

## 2016-08-12 ENCOUNTER — Encounter
Admission: RE | Admit: 2016-08-12 | Discharge: 2016-08-12 | Disposition: A | Discharge status: Home / Self Care | Payer: Managed Care, Other (non HMO) | Source: Ambulatory Visit | Attending: Obstetrics & Gynecology | Admitting: Obstetrics & Gynecology

## 2016-08-12 DIAGNOSIS — Z01812 Encounter for preprocedural laboratory examination: Secondary | ICD-10-CM | POA: Insufficient documentation

## 2016-08-12 HISTORY — DX: Personal history of other diseases of the digestive system: Z87.19

## 2016-08-12 LAB — TYPE AND SCREEN
ABO/RH(D): O POS
Antibody Screen: NEGATIVE

## 2016-08-12 LAB — CBC
HCT: 43.8 % (ref 35.0–47.0)
Hemoglobin: 14.6 g/dL (ref 12.0–16.0)
MCH: 30.3 pg (ref 26.0–34.0)
MCHC: 33.2 g/dL (ref 32.0–36.0)
MCV: 91.1 fL (ref 80.0–100.0)
PLATELETS: 297 10*3/uL (ref 150–440)
RBC: 4.81 MIL/uL (ref 3.80–5.20)
RDW: 12.7 % (ref 11.5–14.5)
WBC: 10.5 10*3/uL (ref 3.6–11.0)

## 2016-08-12 NOTE — Patient Instructions (Signed)
  Your procedure is scheduled JJ:OACZYSA 17, 2017 (Tuesday) Report to Same Day Surgery 2nd floor Medical Mall To find out your arrival time please call [phone] between 1PM - 3PM on August 19, 2016 (Monday)  Remember: Instructions that are not followed completely may result in serious medical risk, up to and including death, or upon the discretion of your surgeon and anesthesiologist your surgery may need to be rescheduled.    _x___ 1. Do not eat food or drink liquids after midnight. No gum chewing or hard candies.     _x_ __ 2. No Alcohol for 24 hours before or after surgery.   __ _x_3. No Smoking for 24 prior to surgery.   ____  4. Bring all medications with you on the day of surgery if instructed.    __x__ 5. Notify your doctor if there is any change in your medical condition     (cold, fever, infections).     Do not wear jewelry, make-up, hairpins, clips or nail polish.  Do not wear lotions, powders, or perfumes. You may wear deodorant.  Do not shave 48 hours prior to surgery. Men may shave face and neck.  Do not bring valuables to the hospital.    [facility] is not responsible for any belongings or valuables.               Contacts, dentures or bridgework may not be worn into surgery.  Leave your suitcase in the car. After surgery it may be brought to your room.  For patients admitted to the hospital, discharge time is determined by your treatment team.   Patients discharged the day of surgery will not be allowed to drive home.    Please read over the following fact sheets that you were given:   [facility] Preparing for [facility] and or MRSA Information   __ Take these medicines the morning of surgery with A SIP OF WATER:    1. May take Xanax the morning of surgery if needed  2.  3.  4.  5.  6.  ____Fleets enema or Magnesium Citrate as directed.   _x___ Use CHG Soap or sage wipes as directed on instruction sheet   ____ Use inhalers on the day of surgery and  bring to hospital day of surgery  ____ Stop metformin 2 days prior to surgery    ____ Take 1/2 of usual insulin dose the night before surgery and none on the morning of           surgery.   __x__ Stop aspirin or coumadin, or plavix (NO ASPIRIN)       x__ Stop Anti-inflammatories such as Advil, Aleve, Ibuprofen, Motrin, Naproxen,          Naprosyn, Goodies powders or aspirin products. Ok to take Tylenol.   _x___ Stop supplements until after surgery.  (Stop Vitamin C, B Complex and Omega 3  Now)  ____ Bring C-Pap to the hospital.

## 2016-08-20 ENCOUNTER — Ambulatory Visit
Admission: RE | Admit: 2016-08-20 | Discharge: 2016-08-20 | Disposition: A | Discharge status: Home / Self Care | Payer: Managed Care, Other (non HMO) | Source: Ambulatory Visit | Attending: Obstetrics & Gynecology | Admitting: Obstetrics & Gynecology

## 2016-08-20 ENCOUNTER — Ambulatory Visit: Payer: Managed Care, Other (non HMO) | Admitting: Certified Registered Nurse Anesthetist

## 2016-08-20 ENCOUNTER — Encounter
Admission: RE | Disposition: A | Discharge status: Home / Self Care | Payer: Self-pay | Source: Ambulatory Visit | Attending: Obstetrics & Gynecology

## 2016-08-20 ENCOUNTER — Encounter: Payer: Self-pay | Admitting: *Deleted

## 2016-08-20 DIAGNOSIS — N920 Excessive and frequent menstruation with regular cycle: Secondary | ICD-10-CM | POA: Diagnosis present

## 2016-08-20 DIAGNOSIS — D251 Intramural leiomyoma of uterus: Secondary | ICD-10-CM | POA: Insufficient documentation

## 2016-08-20 DIAGNOSIS — J329 Chronic sinusitis, unspecified: Secondary | ICD-10-CM | POA: Diagnosis not present

## 2016-08-20 DIAGNOSIS — D259 Leiomyoma of uterus, unspecified: Secondary | ICD-10-CM

## 2016-08-20 DIAGNOSIS — F419 Anxiety disorder, unspecified: Secondary | ICD-10-CM | POA: Insufficient documentation

## 2016-08-20 DIAGNOSIS — N858 Other specified noninflammatory disorders of uterus: Secondary | ICD-10-CM | POA: Diagnosis not present

## 2016-08-20 HISTORY — PX: CYSTOSCOPY: SHX5120

## 2016-08-20 HISTORY — PX: LAPAROSCOPIC BILATERAL SALPINGECTOMY: SHX5889

## 2016-08-20 HISTORY — PX: LAPAROSCOPIC HYSTERECTOMY: SHX1926

## 2016-08-20 LAB — POCT PREGNANCY, URINE: PREG TEST UR: NEGATIVE

## 2016-08-20 SURGERY — HYSTERECTOMY, TOTAL, LAPAROSCOPIC
Anesthesia: General

## 2016-08-20 MED ORDER — FENTANYL CITRATE (PF) 100 MCG/2ML IJ SOLN: 25.0000 ug | INTRAMUSCULAR | Status: DC | PRN

## 2016-08-20 MED ORDER — FAMOTIDINE 20 MG PO TABS
20.0000 mg | ORAL_TABLET | Freq: Once | ORAL | Status: AC
  Administered 2016-08-20: 20 mg via ORAL

## 2016-08-20 MED ORDER — CEFOXITIN SODIUM-DEXTROSE 2-2.2 GM-% IV SOLR (PREMIX)
INTRAVENOUS | Status: AC
  Filled 2016-08-20: qty 50

## 2016-08-20 MED ORDER — ONDANSETRON HCL 4 MG/2ML IJ SOLN: 4.0000 mg | Freq: Once | INTRAMUSCULAR | Status: DC | PRN

## 2016-08-20 MED ORDER — MIDAZOLAM HCL 2 MG/2ML IJ SOLN
INTRAMUSCULAR | Status: DC | PRN
Start: 2016-08-20 — End: 2016-08-20
  Administered 2016-08-20 (×2): 1 mg via INTRAVENOUS

## 2016-08-20 MED ORDER — LIDOCAINE HCL (CARDIAC) 20 MG/ML IV SOLN
INTRAVENOUS | Status: DC | PRN
  Administered 2016-08-20: 30 mg via INTRAVENOUS

## 2016-08-20 MED ORDER — OXYCODONE-ACETAMINOPHEN 5-325 MG PO TABS: 1.0000 | ORAL_TABLET | ORAL | 0 refills | Status: DC | PRN

## 2016-08-20 MED ORDER — MORPHINE SULFATE (PF) 2 MG/ML IV SOLN: 1.0000 mg | INTRAVENOUS | Status: DC | PRN

## 2016-08-20 MED ORDER — KETOROLAC TROMETHAMINE 30 MG/ML IJ SOLN
INTRAMUSCULAR | Status: DC | PRN
  Administered 2016-08-20: 30 mg via INTRAVENOUS

## 2016-08-20 MED ORDER — ACETAMINOPHEN 325 MG PO TABS: 650.0000 mg | ORAL_TABLET | ORAL | Status: DC | PRN

## 2016-08-20 MED ORDER — BUPIVACAINE HCL (PF) 0.5 % IJ SOLN
INTRAMUSCULAR | Status: DC | PRN
  Administered 2016-08-20: 10 mL

## 2016-08-20 MED ORDER — OXYCODONE-ACETAMINOPHEN 5-325 MG PO TABS
1.0000 | ORAL_TABLET | ORAL | Status: DC | PRN
  Administered 2016-08-20: 1 via ORAL

## 2016-08-20 MED ORDER — OXYCODONE-ACETAMINOPHEN 5-325 MG PO TABS
ORAL_TABLET | ORAL | Status: AC
  Filled 2016-08-20: qty 1

## 2016-08-20 MED ORDER — CEFOXITIN SODIUM-DEXTROSE 2-2.2 GM-% IV SOLR (PREMIX)
2.0000 g | Freq: Once | INTRAVENOUS | Status: AC
  Administered 2016-08-20: 2 g via INTRAVENOUS

## 2016-08-20 MED ORDER — SUGAMMADEX SODIUM 200 MG/2ML IV SOLN
INTRAVENOUS | Status: DC | PRN
  Administered 2016-08-20: 130 mg via INTRAVENOUS

## 2016-08-20 MED ORDER — ROCURONIUM BROMIDE 100 MG/10ML IV SOLN
INTRAVENOUS | Status: DC | PRN
  Administered 2016-08-20: 5 mg via INTRAVENOUS
  Administered 2016-08-20: 30 mg via INTRAVENOUS

## 2016-08-20 MED ORDER — PROPOFOL 10 MG/ML IV BOLUS
INTRAVENOUS | Status: DC | PRN
  Administered 2016-08-20: 150 mg via INTRAVENOUS

## 2016-08-20 MED ORDER — KETOROLAC TROMETHAMINE 30 MG/ML IJ SOLN
30.0000 mg | Freq: Four times a day (QID) | INTRAMUSCULAR | Status: DC
  Filled 2016-08-20 (×5): qty 1

## 2016-08-20 MED ORDER — ACETAMINOPHEN 10 MG/ML IV SOLN
INTRAVENOUS | Status: DC | PRN
  Administered 2016-08-20: 1000 mg via INTRAVENOUS

## 2016-08-20 MED ORDER — FAMOTIDINE 20 MG PO TABS
ORAL_TABLET | ORAL | Status: AC
Start: 2016-08-20 — End: 2016-08-20
  Administered 2016-08-20: 20 mg via ORAL
  Filled 2016-08-20: qty 1

## 2016-08-20 MED ORDER — CEFAZOLIN SODIUM-DEXTROSE 2-4 GM/100ML-% IV SOLN
INTRAVENOUS | Status: DC
Start: 2016-08-20 — End: 2016-08-20
  Filled 2016-08-20: qty 100

## 2016-08-20 MED ORDER — DEXAMETHASONE SODIUM PHOSPHATE 10 MG/ML IJ SOLN
INTRAMUSCULAR | Status: DC | PRN
  Administered 2016-08-20: 10 mg via INTRAVENOUS

## 2016-08-20 MED ORDER — ONDANSETRON HCL 4 MG/2ML IJ SOLN
INTRAMUSCULAR | Status: DC | PRN
  Administered 2016-08-20: 4 mg via INTRAVENOUS

## 2016-08-20 MED ORDER — ACETAMINOPHEN 10 MG/ML IV SOLN
INTRAVENOUS | Status: AC
  Filled 2016-08-20: qty 100

## 2016-08-20 MED ORDER — FENTANYL CITRATE (PF) 100 MCG/2ML IJ SOLN
INTRAMUSCULAR | Status: DC | PRN
  Administered 2016-08-20: 100 ug via INTRAVENOUS
  Administered 2016-08-20: 25 ug via INTRAVENOUS
  Administered 2016-08-20: 50 ug via INTRAVENOUS
  Administered 2016-08-20: 25 ug via INTRAVENOUS

## 2016-08-20 MED ORDER — LACTATED RINGERS IV SOLN
INTRAVENOUS | Status: DC
  Administered 2016-08-20: 07:00:00 via INTRAVENOUS

## 2016-08-20 MED ORDER — ACETAMINOPHEN 650 MG RE SUPP
650.0000 mg | RECTAL | Status: DC | PRN
  Filled 2016-08-20: qty 1

## 2016-08-20 SURGICAL SUPPLY — 56 items
BAG URO DRAIN 2000ML W/SPOUT (MISCELLANEOUS) ×4 IMPLANT
BLADE SURG SZ11 CARB STEEL (BLADE) ×4 IMPLANT
CANISTER SUCT 1200ML W/VALVE (MISCELLANEOUS) ×4 IMPLANT
CATH FOLEY 2WAY  5CC 16FR (CATHETERS) ×2
CATH ROBINSON RED A/P 16FR (CATHETERS) IMPLANT
CATH URTH 16FR FL 2W BLN LF (CATHETERS) ×2 IMPLANT
CHLORAPREP W/TINT 26ML (MISCELLANEOUS) ×4 IMPLANT
DEFOGGER SCOPE WARMER CLEARIFY (MISCELLANEOUS) ×4 IMPLANT
DEVICE SUTURE ENDOST 10MM (ENDOMECHANICALS) ×4 IMPLANT
DRAPE CAMERA CLOSED 9X96 (DRAPES) ×4 IMPLANT
DRAPE UNDER BUTTOCK W/FLU (DRAPES) ×4 IMPLANT
DRESSING TELFA 4X3 1S ST N-ADH (GAUZE/BANDAGES/DRESSINGS) IMPLANT
DRSG TEGADERM 2-3/8X2-3/4 SM (GAUZE/BANDAGES/DRESSINGS) IMPLANT
ENDOPOUCH RETRIEVER 10 (MISCELLANEOUS) IMPLANT
ENDOSTITCH 0 SINGLE 48 (SUTURE) ×20 IMPLANT
GAUZE SPONGE NON-WVN 2X2 STRL (MISCELLANEOUS) IMPLANT
GLOVE BIO SURGEON STRL SZ8 (GLOVE) ×20 IMPLANT
GLOVE INDICATOR 8.0 STRL GRN (GLOVE) ×20 IMPLANT
GOWN STRL REUS W/ TWL LRG LVL3 (GOWN DISPOSABLE) ×2 IMPLANT
GOWN STRL REUS W/ TWL XL LVL3 (GOWN DISPOSABLE) ×4 IMPLANT
GOWN STRL REUS W/TWL LRG LVL3 (GOWN DISPOSABLE) ×2
GOWN STRL REUS W/TWL XL LVL3 (GOWN DISPOSABLE) ×4
IRRIGATION STRYKERFLOW (MISCELLANEOUS) ×2 IMPLANT
IRRIGATOR STRYKERFLOW (MISCELLANEOUS) ×4
IV LACTATED RINGERS 1000ML (IV SOLUTION) ×4 IMPLANT
KIT PINK PAD W/HEAD ARE REST (MISCELLANEOUS) ×4
KIT PINK PAD W/HEAD ARM REST (MISCELLANEOUS) ×2 IMPLANT
KIT RM TURNOVER CYSTO AR (KITS) ×4 IMPLANT
LABEL OR SOLS (LABEL) ×4 IMPLANT
LIQUID BAND (GAUZE/BANDAGES/DRESSINGS) ×4 IMPLANT
MANIPULATOR VCARE LG CRV RETR (MISCELLANEOUS) IMPLANT
MANIPULATOR VCARE SML CRV RETR (MISCELLANEOUS) ×4 IMPLANT
MANIPULATOR VCARE STD CRV RETR (MISCELLANEOUS) IMPLANT
NEEDLE VERESS 14GA 120MM (NEEDLE) ×4 IMPLANT
NS IRRIG 500ML POUR BTL (IV SOLUTION) ×4 IMPLANT
OCCLUDER COLPOPNEUMO (BALLOONS) IMPLANT
PACK GYN LAPAROSCOPIC (MISCELLANEOUS) ×4 IMPLANT
PAD OB MATERNITY 4.3X12.25 (PERSONAL CARE ITEMS) ×4 IMPLANT
PAD PREP 24X41 OB/GYN DISP (PERSONAL CARE ITEMS) ×4 IMPLANT
SCISSORS METZENBAUM CVD 33 (INSTRUMENTS) ×4 IMPLANT
SET CYSTO W/LG BORE CLAMP LF (SET/KITS/TRAYS/PACK) ×4 IMPLANT
SHEARS HARMONIC ACE PLUS 36CM (ENDOMECHANICALS) ×8 IMPLANT
SLEEVE ENDOPATH XCEL 5M (ENDOMECHANICALS) ×4 IMPLANT
SPONGE LAP 18X18 5 PK (GAUZE/BANDAGES/DRESSINGS) IMPLANT
SPONGE VERSALON 2X2 STRL (MISCELLANEOUS)
SPONGE XRAY 4X4 16PLY STRL (MISCELLANEOUS) ×4 IMPLANT
STRAP SAFETY BODY (MISCELLANEOUS) ×4 IMPLANT
SURGILUBE 2OZ TUBE FLIPTOP (MISCELLANEOUS) ×4 IMPLANT
SUT VIC AB 2-0 UR6 27 (SUTURE) IMPLANT
SUT VIC AB 4-0 PS2 18 (SUTURE) ×4 IMPLANT
SYR 50ML LL SCALE MARK (SYRINGE) ×4 IMPLANT
SYRINGE 10CC LL (SYRINGE) ×4 IMPLANT
TROCAR ENDO BLADELESS 11MM (ENDOMECHANICALS) ×4 IMPLANT
TROCAR XCEL NON-BLD 5MMX100MML (ENDOMECHANICALS) ×4 IMPLANT
TUBING INSUFFLATOR HEATED (MISCELLANEOUS) ×4 IMPLANT
TUBING INSUFFLATOR HI FLOW (MISCELLANEOUS) ×4 IMPLANT

## 2016-08-20 NOTE — Anesthesia Preprocedure Evaluation (Signed)
Anesthesia Evaluation  Patient identified by MRN, date of birth, ID band Patient awake    Reviewed: Allergy & Precautions, NPO status , Patient's Chart, lab work & pertinent test results  Airway Mallampati: I  TM Distance: >3 FB     Dental no notable dental hx.    Pulmonary  Chronic sinusitis   Pulmonary exam normal        Cardiovascular negative cardio ROS Normal cardiovascular exam     Neuro/Psych PSYCHIATRIC DISORDERS Anxiety  Neuromuscular disease    GI/Hepatic Neg liver ROS, IBS   Endo/Other  negative endocrine ROS  Renal/GU negative Renal ROS  Female GU complaint     Musculoskeletal negative musculoskeletal ROS (+)   Abdominal Normal abdominal exam  (+)   Peds negative pediatric ROS (+)  Hematology negative hematology ROS (+)   Anesthesia Other Findings Past Medical History: 03/25/2016: Acute abdominal pain in right lower quadrant 09/27/2015: Anxiety disorder No date: Anxiety state, unspecified No date: Chronic lymphocytic thyroiditis No date: History of Crohn's disease No date: Irritable bowel syndrome No date: Other chest pain No date: Other specified congenital anomaly of skin No date: Sciatica No date: Unspecified chronic bronchitis No date: Unspecified sinusitis (chronic) No date: Vaginitis and vulvovaginitis, unspecified  Reproductive/Obstetrics negative OB ROS                             Anesthesia Physical Anesthesia Plan  ASA: II  Anesthesia Plan: General   Post-op Pain Management:    Induction: Intravenous  Airway Management Planned: Oral ETT  Additional Equipment:   Intra-op Plan:   Post-operative Plan: Extubation in OR  Informed Consent: I have reviewed the patients History and Physical, chart, labs and discussed the procedure including the risks, benefits and alternatives for the proposed anesthesia with the patient or authorized representative  who has indicated his/her understanding and acceptance.     Plan Discussed with: CRNA and Surgeon  Anesthesia Plan Comments:         Anesthesia Quick Evaluation

## 2016-08-20 NOTE — Transfer of Care (Signed)
Immediate Anesthesia Transfer of Care Note  Patient: HAZELLE WOOLLARD  Procedure(s) Performed: Procedure(s): HYSTERECTOMY TOTAL LAPAROSCOPIC (N/A) LAPAROSCOPIC BILATERAL SALPINGECTOMY (Bilateral) CYSTOSCOPY (N/A)  Patient Location: PACU  Anesthesia Type:General  Level of Consciousness: awake  Airway & Oxygen Therapy: Patient Spontanous Breathing and Patient connected to face mask oxygen  Post-op Assessment: Report given to RN and Post -op Vital signs reviewed and stable  Post vital signs: Reviewed and stable  Last Vitals:  Vitals:   08/20/16 0618  BP: (!) 141/89  Pulse: 95  Resp: 16  Temp: 36.9 C    Last Pain:  Vitals:   08/20/16 0618  TempSrc: Tympanic         Complications: No apparent anesthesia complications

## 2016-08-20 NOTE — OR Nursing (Signed)
Patient tolerating po fluids and crackers.

## 2016-08-20 NOTE — Op Note (Signed)
Operative Note:  PRE-OP DIAGNOSIS: MENORRHAGIA,FIBROID UTERUS   POST-OP DIAGNOSIS: MENORRHAGIA,FIBROID UTERUS   PROCEDURE: Procedure(s): HYSTERECTOMY TOTAL LAPAROSCOPIC LAPAROSCOPIC BILATERAL SALPINGECTOMY CYSTOSCOPY  SURGEON: Barnett Applebaum, MD, FACOG  ASSISTANT: Dr Glennon Mac   ANESTHESIA: General endotracheal anesthesia  ESTIMATED BLOOD LOSS: Minimal, <20 mL  SPECIMENS: Uterus, Tubes.  COMPLICATIONS: None  DISPOSITION: stable to PACU  FINDINGS: Intraabdominal adhesions were not noted. 5 cm fundal fibroid.  Normal ovaries, appendix, liver, bowel.  PROCEDURE:  The patient was taken to the OR where anesthesia was administed. She was prepped and draped in the normal sterile fashion in the dorsal lithotomy position in the [place] stirrups. A time out was performed.  Foley catheter inserted. A Graves speculum was inserted, the cervix was grasped with a single tooth tenaculum and the endometrial cavity was sounded. The cervix was progressively dilated to a size 18 [place] with [employer] dilators. A V-Care uterine manipulator was inserted in the usual fashion without incident. Gloves were changed and attention was turned to the abdomen.   An infraumbilical transverse 54m skin incision was made with the scalpel after local anesthesia applied to the skin. A Veress-step needle was inserted in the usual fashion and confirmed using the hanging drop technique. A pneumoperitoneum was obtained by insufflation of CO2 (opening pressure of 495mg) to 1564m. A diagnostic laparoscopy was performed yielding the previously described findings. Attention was turned to the left lower quadrant where after visualization of the inferior epigastric vessels a 5mm42min incision was made with the scalpel. A 5 mm laparoscopic port was inserted. The same procedure was repeated in the right lower quadrant with a 11mm51mcar. Attention was turned to the left aspect of the uterus, where after visualization of the ureter, the round  ligament was coagulated and transected using the 5mm H21monic Scapel. The anterior and posterior leafs of the broad ligament were dissected off as the anterior one was coagulated and transected in a caudal direction towards the cuff of the uterine manipulator. The vesicouterine reflection of the peritoneum was dissected with the harmonic scapel and the bladder flap was created bluntly. Attention was then turned to the left fallopian tube which was recognized by visualization of the fimbria. First the mesosalpinx and then the utero-ovarian ligament were coagulated and transected using the Harmonic Scapel. Attention was turned to the right aspect of the uterus where the same procedure was performed. The uterine vessels were sealed and transected bilaterally using first bipolar cautery and then the harmonic scapel. A 360 degree, circumferential colpotomy was done to completely amputate the uterus with cervix and tubes. Once the specimen was amputated it was delivered through the vagina.   The colpotomy was repaired in a simple interrupted ashion using a 0-Polysorb suture with an endo-stitch device.  Vaginal exam confirms complete closure.  The cavity was copiously irrigated. A survey of the pelvic cavity revealed adequate hemostasis and no injury to bowel, bladder, or ureter.  A diagnostic cystoscopy was performed using saline distension of bladder, with bilateral urine flow from each ureteral orifice.  No bladder lesions or injuries seen.  Foley cathater replaced.   At this point the procedure was finalized. All the instruments were removed from the patient's body. Gas was expelled and patient is leveled.  Incisions are closed with skin adhesive.    Patient goes to recovery room in stable condition.  All sponge, instrument, and needle counts are correct x2.

## 2016-08-20 NOTE — Discharge Instructions (Signed)
Total Laparoscopic Hysterectomy, Care After Refer to this sheet in the next few weeks. These instructions provide you with information on caring for yourself after your procedure. Your health care provider may also give you more specific instructions. Your treatment has been planned according to current medical practices, but problems sometimes occur. Call your health care provider if you have any problems or questions after your procedure. WHAT TO EXPECT AFTER THE PROCEDURE  Pain and bruising at the incision sites. You will be given pain medicine to control it.  Menopausal symptoms such as hot flashes, night sweats, and insomnia if your ovaries were removed.  Sore throat from the breathing tube that was inserted during surgery. HOME CARE INSTRUCTIONS  Only take over-the-counter or prescription medicines for pain, discomfort, or fever as directed by your health care provider.   Do not take aspirin. It can cause bleeding.   Do not drive when taking pain medicine.   Follow your health care provider's advice regarding diet, exercise, lifting, driving, and general activities.   Resume your usual diet as directed and allowed.   Get plenty of rest and sleep.   Do not douche, use tampons, or have sexual intercourse for at least 6 weeks, or until your health care provider gives you permission.   Change your bandages (dressings) as directed by your health care provider.   Monitor your temperature and notify your health care provider of a fever.   Take showers instead of baths for 2-3 weeks.   Do not drink alcohol until your health care provider gives you permission.   If you develop constipation, you may take a mild laxative with your health care provider's permission. Bran foods may help with constipation problems. Drinking enough fluids to keep your urine clear or pale yellow may help as well.   Try to have someone home with you for 1-2 weeks to help around the house.    Keep all of your follow-up appointments as directed by your health care provider.  SEEK MEDICAL CARE IF:  You have swelling, redness, or increasing pain around your incision sites.   You have pus coming from your incision.   You notice a bad smell coming from your incision.   Your incision breaks open.   You feel dizzy or lightheaded.   You have pain or bleeding when you urinate.   You have persistent diarrhea.   You have persistent nausea and vomiting.   You have abnormal vaginal discharge.   You have a rash.   You have any type of abnormal reaction or develop an allergy to your medicine.   You have poor pain control with your prescribed medicine.  SEEK IMMEDIATE MEDICAL CARE IF:  You have chest pain or shortness of breath.  You have severe abdominal pain that is not relieved with pain medicine.  You have pain or swelling in your legs. MAKE SURE YOU:  Understand these instructions.  Will watch your condition.  Will get help right away if you are not doing well or get worse.   This information is not intended to replace advice given to you by your health care provider. Make sure you discuss any questions you have with your health care provider.   Document Released: 08/11/2013 Document Revised: 10/26/2013 Document Reviewed: 08/11/2013 Elsevier Interactive Patient Education 2016 [place]   1) The drugs that you were given will stay in your system until tomorrow so for the next 24 hours you should not:  A) Drive an automobile B) Make any legal decisions C) Drink any alcoholic beverage   2) You may resume regular meals tomorrow.  Today it is better to start with liquids and gradually work up to solid foods.  You may eat anything you prefer, but it is better to start with liquids, then soup and crackers, and gradually work up to solid foods.   3) Please notify your doctor immediately if you have  any unusual bleeding, trouble breathing, redness and pain at the surgery site, drainage, fever, or pain not relieved by medication.    4) Additional Instructions:        Please contact your physician with any problems or Same Day Surgery at [phone], Monday through Friday 6 am to 4 pm, or [place] at [facility] number at [phone].

## 2016-08-20 NOTE — Anesthesia Procedure Notes (Signed)
Procedure Name: Intubation Date/Time: 08/20/2016 7:41 AM Performed by: Johnna Acosta Pre-anesthesia Checklist: Patient identified, Emergency Drugs available, Suction available, Patient being monitored and Timeout performed Patient Re-evaluated:Patient Re-evaluated prior to inductionOxygen Delivery Method: Circle system utilized Preoxygenation: Pre-oxygenation with 100% oxygen Intubation Type: IV induction Ventilation: Mask ventilation without difficulty and Oral airway inserted - appropriate to patient size Laryngoscope Size: Sabra Heck and 2 Grade View: Grade I Tube type: Oral Tube size: 7.0 mm Number of attempts: 1 Airway Equipment and Method: Stylet Placement Confirmation: ETT inserted through vocal cords under direct vision,  positive ETCO2 and breath sounds checked- equal and bilateral Secured at: 20 cm Tube secured with: Tape Dental Injury: Teeth and Oropharynx as per pre-operative assessment

## 2016-08-20 NOTE — H&P (Signed)
History and Physical Interval Note:  08/20/2016 7:10 AM  Betty Rodgers  has presented today for surgery, with the diagnosis of CHRONIC PELVIC PAIN,FIBROID UTERUS  The various methods of treatment have been discussed with the patient and family. After consideration of risks, benefits and other options for treatment, the patient has consented to  Procedure(s): HYSTERECTOMY TOTAL LAPAROSCOPIC (N/A) LAPAROSCOPIC BILATERAL SALPINGECTOMY (Bilateral) CYSTOSCOPY (N/A) as a surgical intervention .  The patient's history has been reviewed, patient examined, no change in status, stable for surgery.  Pt has the following beta blocker history-  Not taking Beta Blocker.  I have reviewed the patient's chart and labs.  Questions were answered to the patient's satisfaction.       Hoyt Koch

## 2016-08-20 NOTE — OR Nursing (Signed)
Patient desires to go home at this time pain improving and tolerating po fluids and crackers

## 2016-08-21 LAB — SURGICAL PATHOLOGY

## 2016-08-21 NOTE — Anesthesia Postprocedure Evaluation (Signed)
Anesthesia Post Note  Patient: Betty Rodgers  Procedure(s) Performed: Procedure(s) (LRB): HYSTERECTOMY TOTAL LAPAROSCOPIC (N/A) LAPAROSCOPIC BILATERAL SALPINGECTOMY (Bilateral) CYSTOSCOPY (N/A)  Patient location during evaluation: PACU Anesthesia Type: General Level of consciousness: awake and alert and oriented Pain management: pain level controlled Vital Signs Assessment: post-procedure vital signs reviewed and stable Respiratory status: spontaneous breathing Cardiovascular status: blood pressure returned to baseline Anesthetic complications: no    Last Vitals:  Vitals:   08/20/16 0957 08/20/16 1032  BP: 138/78 138/78  Pulse: 67 60  Resp: 14 14  Temp: 37.2 C 37.2 C    Last Pain:  Vitals:   08/21/16 0905  TempSrc:   PainSc: 3                  Janay Canan

## 2016-09-13 ENCOUNTER — Other Ambulatory Visit: Payer: Self-pay | Admitting: Obstetrics and Gynecology

## 2016-09-13 ENCOUNTER — Other Ambulatory Visit: Payer: Self-pay | Admitting: Obstetrics & Gynecology

## 2016-09-13 DIAGNOSIS — Z1231 Encounter for screening mammogram for malignant neoplasm of breast: Secondary | ICD-10-CM

## 2016-10-15 ENCOUNTER — Other Ambulatory Visit: Payer: Self-pay | Admitting: Obstetrics & Gynecology

## 2016-10-15 ENCOUNTER — Ambulatory Visit
Admission: RE | Admit: 2016-10-15 | Discharge: 2016-10-15 | Disposition: A | Discharge status: Home / Self Care | Payer: Managed Care, Other (non HMO) | Source: Ambulatory Visit | Attending: Obstetrics & Gynecology | Admitting: Obstetrics & Gynecology

## 2016-10-15 DIAGNOSIS — Z1231 Encounter for screening mammogram for malignant neoplasm of breast: Secondary | ICD-10-CM | POA: Insufficient documentation

## 2016-10-17 ENCOUNTER — Other Ambulatory Visit: Payer: Self-pay | Admitting: *Deleted

## 2016-10-17 ENCOUNTER — Inpatient Hospital Stay
Admission: RE | Admit: 2016-10-17 | Discharge: 2016-10-17 | Disposition: A | Discharge status: Home / Self Care | Payer: Self-pay | Source: Ambulatory Visit | Attending: *Deleted | Admitting: *Deleted

## 2016-10-17 DIAGNOSIS — Z9289 Personal history of other medical treatment: Secondary | ICD-10-CM

## 2016-10-18 LAB — HM MAMMOGRAPHY: HM Mammogram: NORMAL (ref 0–4)

## 2016-10-22 ENCOUNTER — Encounter: Payer: Managed Care, Other (non HMO) | Admitting: Family Medicine

## 2016-10-29 ENCOUNTER — Encounter: Payer: Managed Care, Other (non HMO) | Admitting: Family Medicine

## 2016-10-29 ENCOUNTER — Encounter: Payer: Self-pay | Admitting: Family Medicine

## 2016-10-29 ENCOUNTER — Ambulatory Visit (INDEPENDENT_AMBULATORY_CARE_PROVIDER_SITE_OTHER): Payer: Managed Care, Other (non HMO) | Admitting: Family Medicine

## 2016-10-29 DIAGNOSIS — Z0001 Encounter for general adult medical examination with abnormal findings: Secondary | ICD-10-CM | POA: Diagnosis not present

## 2016-10-29 DIAGNOSIS — Z0000 Encounter for general adult medical examination without abnormal findings: Secondary | ICD-10-CM | POA: Insufficient documentation

## 2016-10-29 DIAGNOSIS — Z833 Family history of diabetes mellitus: Secondary | ICD-10-CM | POA: Diagnosis not present

## 2016-10-29 DIAGNOSIS — F411 Generalized anxiety disorder: Secondary | ICD-10-CM

## 2016-10-29 LAB — LIPID PANEL
CHOL/HDL RATIO: 1.9 ratio (ref <5.0)
CHOLESTEROL: 189 mg/dL (ref <200)
HDL: 100 mg/dL (ref >50)
LDL Cholesterol: 72 mg/dL (ref <100)
Triglycerides: 85 mg/dL (ref <150)
VLDL: 17 mg/dL (ref <30)

## 2016-10-29 LAB — COMPLETE METABOLIC PANEL WITH GFR
ALT: 19 U/L (ref 6–29)
AST: 20 U/L (ref 10–30)
Albumin: 4.9 g/dL (ref 3.6–5.1)
Alkaline Phosphatase: 93 U/L (ref 33–115)
BUN: 13 mg/dL (ref 7–25)
CALCIUM: 9.5 mg/dL (ref 8.6–10.2)
CHLORIDE: 103 mmol/L (ref 98–110)
CO2: 27 mmol/L (ref 20–31)
Creat: 0.64 mg/dL (ref 0.50–1.10)
GFR, Est African American: >89 mL/min (ref >=60)
GFR, Est Non African American: >89 mL/min (ref >=60)
GLUCOSE: 79 mg/dL (ref 65–99)
POTASSIUM: 3.8 mmol/L (ref 3.5–5.3)
SODIUM: 139 mmol/L (ref 135–146)
Total Bilirubin: 0.7 mg/dL (ref 0.2–1.2)
Total Protein: 7.6 g/dL (ref 6.1–8.1)

## 2016-10-29 LAB — CBC WITH DIFFERENTIAL/PLATELET
Basophils Absolute: 0 cells/uL (ref 0–200)
Basophils Relative: 0 %
EOS PCT: 2 %
Eosinophils Absolute: 194 cells/uL (ref 15–500)
HCT: 46.4 % — ABNORMAL HIGH (ref 35.0–45.0)
Hemoglobin: 15.5 g/dL (ref 11.7–15.5)
LYMPHS PCT: 36 %
Lymphs Abs: 3492 cells/uL (ref 850–3900)
MCH: 30.3 pg (ref 27.0–33.0)
MCHC: 33.4 g/dL (ref 32.0–36.0)
MCV: 90.6 fL (ref 80.0–100.0)
MPV: 10.7 fL (ref 7.5–12.5)
Monocytes Absolute: 485 cells/uL (ref 200–950)
Monocytes Relative: 5 %
NEUTROS PCT: 57 %
Neutro Abs: 5529 cells/uL (ref 1500–7800)
Platelets: 334 10*3/uL (ref 140–400)
RBC: 5.12 MIL/uL — ABNORMAL HIGH (ref 3.80–5.10)
RDW: 12.8 % (ref 11.0–15.0)
WBC: 9.7 10*3/uL (ref 3.8–10.8)

## 2016-10-29 LAB — TSH: TSH: 4.2 mIU/L

## 2016-10-29 LAB — POCT GLYCOSYLATED HEMOGLOBIN (HGB A1C): HEMOGLOBIN A1C: 4.8

## 2016-10-29 MED ORDER — ALPRAZOLAM 1 MG PO TABS: 1.0000 mg | ORAL_TABLET | Freq: Three times a day (TID) | ORAL | 5 refills | Status: DC | PRN

## 2016-10-29 NOTE — Progress Notes (Signed)
Name: Betty Rodgers   MRN: [id number]    DOB: 05-Dec-1975   Date:10/29/2016       Progress Note  Subjective  Chief Complaint  Chief Complaint  Patient presents with  . Anxiety    medication refills    Anxiety  Presents for follow-up visit. Symptoms include excessive worry, insomnia, irritability, nervous/anxious behavior and panic. Patient reports no chest pain, decreased concentration, depressed mood, dizziness, muscle tension, nausea, palpitations or shortness of breath. The severity of symptoms is moderate.     Pt. presents for a complete physical exam.  She had colonoscopy this year, due in 2022 Had hysterectomy without oophorectomy in October 2017, pap smear and mammogram to be followed by GYN.   Past Medical History:  Diagnosis Date  . Acute abdominal pain in right lower quadrant 03/25/2016  . Anxiety disorder 09/27/2015  . Anxiety state, unspecified   . Chronic lymphocytic thyroiditis   . History of Crohn's disease   . Irritable bowel syndrome   . Other chest pain   . Other specified congenital anomaly of skin   . Sciatica   . Unspecified chronic bronchitis   . Unspecified sinusitis (chronic)   . Vaginitis and vulvovaginitis, unspecified     Past Surgical History:  Procedure Laterality Date  . ABDOMINAL HYSTERECTOMY    . COLONOSCOPY WITH PROPOFOL N/A 05/09/2016   Procedure: COLONOSCOPY WITH PROPOFOL;  Surgeon: Lucilla Lame, MD;  Location: [place];  Service: Endoscopy;  Laterality: N/A;  PLEASE LEAVE EARLY AS POSSIBLE  . CYSTOSCOPY N/A 08/20/2016   Procedure: CYSTOSCOPY;  Surgeon: Gae Dry, MD;  Location: [facility];  Service: Gynecology;  Laterality: N/A;  . LAPAROSCOPIC BILATERAL SALPINGECTOMY Bilateral 08/20/2016   Procedure: LAPAROSCOPIC BILATERAL SALPINGECTOMY;  Surgeon: Gae Dry, MD;  Location: [facility];  Service: Gynecology;  Laterality: Bilateral;  . LAPAROSCOPIC HYSTERECTOMY N/A 08/20/2016   Procedure: HYSTERECTOMY TOTAL  LAPAROSCOPIC;  Surgeon: Gae Dry, MD;  Location: [facility];  Service: Gynecology;  Laterality: N/A;  . POLYPECTOMY  05/09/2016   Procedure: POLYPECTOMY;  Surgeon: Lucilla Lame, MD;  Location: [place];  Service: Endoscopy;;  . WISDOM TOOTH EXTRACTION      Family History  Problem Relation Age of Onset  . Hypertension Father   . Heart disease Father   . Heart attack Paternal Grandmother   . Breast cancer Neg Hx     Social History   Social History  . Marital status: Single    Spouse name: N/A  . Number of children: N/A  . Years of education: N/A   Occupational History  . Not on file.   Social History Main Topics  . Smoking status: Never Smoker  . Smokeless tobacco: Never Used  . Alcohol use 4.2 oz/week    7 Shots of liquor per week  . Drug use: No  . Sexual activity: Not on file   Other Topics Concern  . Not on file   Social History Narrative  . No narrative on file     Current Outpatient Prescriptions:  .  ALPRAZolam (XANAX) 1 MG tablet, Take 1 tablet (1 mg total) by mouth 3 (three) times daily as needed for anxiety., Disp: 90 tablet, Rfl: 5 .  Ascorbic Acid (VITAMIN C PO), Take 1 tablet by mouth daily. , Disp: , Rfl:  .  B Complex Vitamins (VITAMIN B-COMPLEX PO), Take 1 tablet by mouth daily. , Disp: , Rfl:  .  Cholecalciferol (VITAMIN D PO), Take 1 tablet by mouth daily. ,  Disp: , Rfl:  .  levocetirizine (XYZAL) 5 MG tablet, Take 5 mg by mouth as needed for allergies., Disp: , Rfl:  .  mesalamine (PENTASA) 500 MG CR capsule, Take 1 capsule (500 mg total) by mouth 4 (four) times daily., Disp: 120 capsule, Rfl: 6 .  Omega-3 Fatty Acids (OMEGA 3 PO), Take 1 capsule by mouth daily. , Disp: , Rfl:   Allergies  Allergen Reactions  . Bee Venom      Review of Systems  Constitutional: Positive for irritability. Negative for chills, fever and malaise/fatigue.  HENT: Positive for congestion and sinus pain. Negative for ear pain and sore throat.   Eyes:  Negative for blurred vision and double vision.  Respiratory: Negative for cough, sputum production and shortness of breath.   Cardiovascular: Negative for chest pain, palpitations and leg swelling.  Gastrointestinal: Positive for abdominal pain (Intermittent rlq abdominal wall pain since hysterectomy in October 2017). Negative for blood in stool, nausea and vomiting.  Genitourinary: Negative for dysuria, frequency and hematuria.  Musculoskeletal: Negative for back pain, joint pain and neck pain.  Neurological: Negative for dizziness and headaches.  Psychiatric/Behavioral: Negative for decreased concentration and depression. The patient is nervous/anxious and has insomnia.      Objective  Vitals:   10/29/16 1400  BP: 110/60  Pulse: 98  Resp: 16  Temp: 98.9 F (37.2 C)  TempSrc: Oral  SpO2: 98%  Weight: 138 lb 3.2 oz (62.7 kg)  Height: 5' 4"  (1.626 m)    Physical Exam  Constitutional: She is oriented to person, place, and time and well-developed, well-nourished, and in no distress.  HENT:  Head: Normocephalic and atraumatic.  Right Ear: No drainage or swelling.  Left Ear: No drainage or swelling.  Mouth/Throat: No posterior oropharyngeal edema or posterior oropharyngeal erythema.  Eyes: EOM are normal. Pupils are equal, round, and reactive to light.  Cardiovascular: Normal rate, regular rhythm, S1 normal, S2 normal and normal heart sounds.   No murmur heard. Pulmonary/Chest: Effort normal and breath sounds normal. She has no wheezes.  Abdominal: Soft. Bowel sounds are normal. There is tenderness in the right lower quadrant. There is no rebound.  Healing surgical incision scar on the right lower quadrant abdominal wall, mild tenderness to palpation over the right suprapubic and right lower quadrant area. No rebound or guarding  Musculoskeletal:       Right ankle: She exhibits no swelling.       Left ankle: She exhibits no swelling.  Neurological: She is alert and oriented to  person, place, and time.  Psychiatric: Mood, memory, affect and judgment normal.  Nursing note and vitals reviewed.      Assessment & Plan  1. Generalized anxiety disorder Stable, continue on alprazolam taken up to 3 times daily as needed, refills provided. - ALPRAZolam (XANAX) 1 MG tablet; Take 1 tablet (1 mg total) by mouth 3 (three) times daily as needed for anxiety.  Dispense: 90 tablet; Refill: 5  2. Family history of diabetes mellitus in father A1C is 4.8%, considered normal - POCT HgB A1C  3. Well woman exam without gynecological exam Obtain age appropriate laboratory screenings - CBC with Differential - COMPLETE METABOLIC PANEL WITH GFR - Lipid Profile - TSH - Vitamin D (25 hydroxy)   Eliyas Suddreth Asad A. [facility] 10/29/2016 2:07 PM

## 2016-10-30 LAB — VITAMIN D 25 HYDROXY (VIT D DEFICIENCY, FRACTURES): VIT D 25 HYDROXY: 35 ng/mL (ref 30–100)

## 2017-01-21 ENCOUNTER — Ambulatory Visit
Admission: EM | Admit: 2017-01-21 | Discharge: 2017-01-21 | Disposition: A | Discharge status: Home / Self Care | Payer: 59 | Attending: Emergency Medicine | Admitting: Emergency Medicine

## 2017-01-21 ENCOUNTER — Encounter: Payer: Self-pay | Admitting: Emergency Medicine

## 2017-01-21 DIAGNOSIS — R6889 Other general symptoms and signs: Secondary | ICD-10-CM

## 2017-01-21 MED ORDER — HYDROCOD POLST-CPM POLST ER 10-8 MG/5ML PO SUER: 5.0000 mL | Freq: Two times a day (BID) | ORAL | 0 refills | Status: DC

## 2017-01-21 MED ORDER — FLUTICASONE PROPIONATE 50 MCG/ACT NA SUSP: 2.0000 | Freq: Every day | NASAL | 0 refills | Status: DC

## 2017-01-21 MED ORDER — BENZONATATE 200 MG PO CAPS: ORAL_CAPSULE | ORAL | 0 refills | Status: DC

## 2017-01-21 NOTE — ED Provider Notes (Signed)
CSN: [id number]     Arrival date & time 01/21/17  5035 History   First MD Initiated Contact with Patient 01/21/17 1000     Chief Complaint  Patient presents with  . Cough  . Headache   (Consider location/radiation/quality/duration/timing/severity/associated sxs/prior Treatment) HPI 41 year old female who presents with the sudden onset this morning of cough congestion headaches and body aches. His had chills but did not  take her temperature. She is  Afebrile the present time. Pulse rate however is 112 blood pressure 131/91 O2 sats on room air is 100%. Her cough this morning earlier was productive but now has tapered to a dry hacking cough. She has body aches complaining mostly of her shoulders and back. Is no known sick contacts.        Past Medical History:  Diagnosis Date  . Acute abdominal pain in right lower quadrant 03/25/2016  . Anxiety disorder 09/27/2015  . Anxiety state, unspecified   . Chronic lymphocytic thyroiditis   . History of Crohn's disease   . Irritable bowel syndrome   . Other chest pain   . Other specified congenital anomaly of skin   . Sciatica   . Unspecified chronic bronchitis   . Unspecified sinusitis (chronic)   . Vaginitis and vulvovaginitis, unspecified    Past Surgical History:  Procedure Laterality Date  . ABDOMINAL HYSTERECTOMY    . COLONOSCOPY WITH PROPOFOL N/A 05/09/2016   Procedure: COLONOSCOPY WITH PROPOFOL;  Surgeon: Lucilla Lame, MD;  Location: [place];  Service: Endoscopy;  Laterality: N/A;  PLEASE LEAVE EARLY AS POSSIBLE  . CYSTOSCOPY N/A 08/20/2016   Procedure: CYSTOSCOPY;  Surgeon: Gae Dry, MD;  Location: [facility];  Service: Gynecology;  Laterality: N/A;  . LAPAROSCOPIC BILATERAL SALPINGECTOMY Bilateral 08/20/2016   Procedure: LAPAROSCOPIC BILATERAL SALPINGECTOMY;  Surgeon: Gae Dry, MD;  Location: [facility];  Service: Gynecology;  Laterality: Bilateral;  . LAPAROSCOPIC HYSTERECTOMY N/A 08/20/2016   Procedure:  HYSTERECTOMY TOTAL LAPAROSCOPIC;  Surgeon: Gae Dry, MD;  Location: [facility];  Service: Gynecology;  Laterality: N/A;  . POLYPECTOMY  05/09/2016   Procedure: POLYPECTOMY;  Surgeon: Lucilla Lame, MD;  Location: [place];  Service: Endoscopy;;  . WISDOM TOOTH EXTRACTION     Family History  Problem Relation Age of Onset  . Hypertension Father   . Heart disease Father   . Heart attack Paternal Grandmother   . Breast cancer Neg Hx    Social History  Substance Use Topics  . Smoking status: Never Smoker  . Smokeless tobacco: Never Used  . Alcohol use 4.2 oz/week    7 Shots of liquor per week   OB History    No data available     Review of Systems  Constitutional: Positive for activity change, chills and fatigue.  HENT: Positive for congestion and rhinorrhea.   Respiratory: Positive for cough. Negative for shortness of breath, wheezing and stridor.   All other systems reviewed and are negative.   Allergies  Bee venom  Home Medications   Prior to Admission medications   Medication Sig Start Date End Date Taking? Authorizing Provider  ALPRAZolam Duanne Moron) 1 MG tablet Take 1 tablet (1 mg total) by mouth 3 (three) times daily as needed for anxiety. 10/29/16   Roselee Nova, MD  Ascorbic Acid (VITAMIN C PO) Take 1 tablet by mouth daily.     Historical Provider, MD  B Complex Vitamins (VITAMIN B-COMPLEX PO) Take 1 tablet by mouth daily.     Historical  Provider, MD  benzonatate (TESSALON) 200 MG capsule Take one cap TID PRN cough 01/21/17   Lorin Picket, PA-C  chlorpheniramine-HYDROcodone [facility]) 10-8 MG/5ML SUER Take 5 mLs by mouth 2 (two) times daily. 01/21/17   Lorin Picket, PA-C  Cholecalciferol (VITAMIN D PO) Take 1 tablet by mouth daily.     Historical Provider, MD  fluticasone (FLONASE) 50 MCG/ACT nasal spray Place 2 sprays into both nostrils daily. 01/21/17   Lorin Picket, PA-C  levocetirizine (XYZAL) 5 MG tablet Take 5 mg by mouth as  needed for allergies.    Historical Provider, MD  mesalamine (PENTASA) 500 MG CR capsule Take 1 capsule (500 mg total) by mouth 4 (four) times daily. 05/23/16   Lucilla Lame, MD  Omega-3 Fatty Acids (OMEGA 3 PO) Take 1 capsule by mouth daily.     Historical Provider, MD   Meds Ordered and Administered this Visit  Medications - No data to display  BP (!) 131/91 (BP Location: Left Arm)   Pulse (!) 112   Temp 98.8 F (37.1 C) (Oral)   Resp 16   Ht 5' 4"  (1.626 m)   Wt 140 lb (63.5 kg)   LMP 08/16/2016   SpO2 100%   BMI 24.03 kg/m  No data found.   Physical Exam  Constitutional: She is oriented to person, place, and time. She appears well-developed and well-nourished. No distress.  HENT:  Head: Normocephalic and atraumatic.  Nose: Nose normal.  Mouth/Throat: Oropharynx is clear and moist.  Both canals are occluded with cerumen  Eyes: Pupils are equal, round, and reactive to light. Right eye exhibits no discharge. Left eye exhibits no discharge.  Neck: Normal range of motion.  Pulmonary/Chest: Effort normal and breath sounds normal. No respiratory distress. She has no wheezes. She has no rales.  Musculoskeletal: Normal range of motion.  Neurological: She is alert and oriented to person, place, and time.  Skin: Skin is warm and dry. She is not diaphoretic.  Psychiatric: She has a normal mood and affect. Her behavior is normal. Judgment and thought content normal.  Nursing note and vitals reviewed.   Urgent Care Course     Procedures (including critical care time)  Labs Review Labs Reviewed - No data to display  Imaging Review No results found.   Visual Acuity Review  Right Eye Distance:   Left Eye Distance:   Bilateral Distance:    Right Eye Near:   Left Eye Near:    Bilateral Near:         MDM   1. Flu-like symptoms    Medications - No data to display    Patient was given Tessalon Perles for coughing; Tussionex for nighttime cough relief and Flonase.  She refused Tamiflu preferring to allow the illness to run its course. If she worsens she may return to thisr clinic or go to her primary care physician. I've given her off work 2 days.    Lorin Picket, PA-C 01/21/17 [place] Rayan Dyal, PA-C 01/21/17 1130

## 2017-01-21 NOTE — ED Triage Notes (Signed)
Patient c/o cough, congestion, HAs, and bodyaches that started this morning.  Patient denies fevers.

## 2017-05-27 ENCOUNTER — Ambulatory Visit (INDEPENDENT_AMBULATORY_CARE_PROVIDER_SITE_OTHER): Payer: Managed Care, Other (non HMO) | Admitting: Family Medicine

## 2017-05-27 ENCOUNTER — Encounter: Payer: Self-pay | Admitting: Family Medicine

## 2017-05-27 DIAGNOSIS — F411 Generalized anxiety disorder: Secondary | ICD-10-CM | POA: Diagnosis not present

## 2017-05-27 MED ORDER — ALPRAZOLAM 1 MG PO TABS: 1.0000 mg | ORAL_TABLET | Freq: Three times a day (TID) | ORAL | 5 refills | Status: DC | PRN

## 2017-05-27 NOTE — Progress Notes (Signed)
Name: Betty Rodgers   MRN: [id number]    DOB: [birth year]   Date:05/27/2017       Progress Note  Subjective  Chief Complaint  Chief Complaint  Patient presents with  . Medication Refill    Xanax    Anxiety  Presents for follow-up visit. Symptoms include excessive worry, insomnia, irritability and nervous/anxious behavior. Patient reports no chest pain, decreased concentration, depressed mood, dizziness, muscle tension, nausea, panic or shortness of breath. The severity of symptoms is moderate. The quality of sleep is good.       Past Medical History:  Diagnosis Date  . Acute abdominal pain in right lower quadrant 03/25/2016  . Anxiety disorder 09/27/2015  . Anxiety state, unspecified   . Chronic lymphocytic thyroiditis   . History of Crohn's disease   . Irritable bowel syndrome   . Other chest pain   . Other specified congenital anomaly of skin   . Sciatica   . Unspecified chronic bronchitis ([place])   . Unspecified sinusitis (chronic)   . Vaginitis and vulvovaginitis, unspecified     Past Surgical History:  Procedure Laterality Date  . ABDOMINAL HYSTERECTOMY    . COLONOSCOPY WITH PROPOFOL N/A 05/09/2016   Procedure: COLONOSCOPY WITH PROPOFOL;  Surgeon: Lucilla Lame, MD;  Location: [place];  Service: Endoscopy;  Laterality: N/A;  PLEASE LEAVE EARLY AS POSSIBLE  . CYSTOSCOPY N/A 08/20/2016   Procedure: CYSTOSCOPY;  Surgeon: Gae Dry, MD;  Location: [facility];  Service: Gynecology;  Laterality: N/A;  . LAPAROSCOPIC BILATERAL SALPINGECTOMY Bilateral 08/20/2016   Procedure: LAPAROSCOPIC BILATERAL SALPINGECTOMY;  Surgeon: Gae Dry, MD;  Location: [facility];  Service: Gynecology;  Laterality: Bilateral;  . LAPAROSCOPIC HYSTERECTOMY N/A 08/20/2016   Procedure: HYSTERECTOMY TOTAL LAPAROSCOPIC;  Surgeon: Gae Dry, MD;  Location: [facility];  Service: Gynecology;  Laterality: N/A;  . POLYPECTOMY  05/09/2016   Procedure: POLYPECTOMY;  Surgeon: Lucilla Lame, MD;   Location: [place];  Service: Endoscopy;;  . WISDOM TOOTH EXTRACTION      Family History  Problem Relation Age of Onset  . Hypertension Father   . Heart disease Father   . Heart attack Paternal Grandmother   . Breast cancer Neg Hx     Social History   Social History  . Marital status: Single    Spouse name: N/A  . Number of children: N/A  . Years of education: N/A   Occupational History  . Not on file.   Social History Main Topics  . Smoking status: Never Smoker  . Smokeless tobacco: Never Used  . Alcohol use 4.2 oz/week    7 Shots of liquor per week  . Drug use: No  . Sexual activity: Not on file   Other Topics Concern  . Not on file   Social History Narrative  . No narrative on file     Current Outpatient Prescriptions:  .  ALPRAZolam (XANAX) 1 MG tablet, Take 1 tablet (1 mg total) by mouth 3 (three) times daily as needed for anxiety., Disp: 90 tablet, Rfl: 5 .  Ascorbic Acid (VITAMIN C PO), Take 1 tablet by mouth daily. , Disp: , Rfl:  .  B Complex Vitamins (VITAMIN B-COMPLEX PO), Take 1 tablet by mouth daily. , Disp: , Rfl:  .  Cholecalciferol (VITAMIN D PO), Take 1 tablet by mouth daily. , Disp: , Rfl:  .  fluticasone (FLONASE) 50 MCG/ACT nasal spray, Place 2 sprays into both nostrils daily., Disp: 16 g, Rfl: 0 .  levocetirizine (XYZAL) 5 MG tablet, Take 5 mg by mouth as needed for allergies., Disp: , Rfl:  .  Omega-3 Fatty Acids (OMEGA 3 PO), Take 1 capsule by mouth daily. , Disp: , Rfl:  .  benzonatate (TESSALON) 200 MG capsule, Take one cap TID PRN cough (Patient not taking: Reported on 05/27/2017), Disp: 30 capsule, Rfl: 0 .  chlorpheniramine-HYDROcodone (TUSSIONEX PENNKINETIC ER) 10-8 MG/5ML SUER, Take 5 mLs by mouth 2 (two) times daily. (Patient not taking: Reported on 05/27/2017), Disp: 115 mL, Rfl: 0 .  mesalamine (PENTASA) 500 MG CR capsule, Take 1 capsule (500 mg total) by mouth 4 (four) times daily. (Patient not taking: Reported on 05/27/2017),  Disp: 120 capsule, Rfl: 6  Allergies  Allergen Reactions  . Bee Venom      Review of Systems  Constitutional: Positive for irritability.  Respiratory: Negative for shortness of breath.   Cardiovascular: Negative for chest pain.  Gastrointestinal: Negative for nausea.  Neurological: Negative for dizziness.  Psychiatric/Behavioral: Negative for decreased concentration. The patient is nervous/anxious and has insomnia.       Objective  Vitals:   05/27/17 0831  BP: 128/75  Pulse: 96  Resp: 16  Temp: 98.6 F (37 C)  TempSrc: Oral  SpO2: 97%  Weight: 145 lb 14.4 oz (66.2 kg)  Height: 5' 4"  (1.626 m)    Physical Exam  Constitutional: She is oriented to person, place, and time and well-developed, well-nourished, and in no distress.  HENT:  Head: Normocephalic and atraumatic.  Cardiovascular: Normal rate, regular rhythm and normal heart sounds.   No murmur heard. Pulmonary/Chest: Effort normal and breath sounds normal. She has no wheezes.  Neurological: She is alert and oriented to person, place, and time.  Psychiatric: Mood, memory, affect and judgment normal.  Nursing note and vitals reviewed.    Assessment & Plan  1. Generalized anxiety disorder Stable and responsive to alprazolam taken up to 3 times daily as needed, patient compliant with controlled substances agreement, refills provided and follow-up in 6 months - ALPRAZolam (XANAX) 1 MG tablet; Take 1 tablet (1 mg total) by mouth 3 (three) times daily as needed for anxiety.  Dispense: 90 tablet; Refill: 5   Hideko Esselman Asad A. [facility] 05/27/2017 8:43 AM

## 2017-06-02 ENCOUNTER — Other Ambulatory Visit: Payer: Self-pay | Admitting: Family Medicine

## 2017-06-02 DIAGNOSIS — F411 Generalized anxiety disorder: Secondary | ICD-10-CM

## 2017-07-28 ENCOUNTER — Other Ambulatory Visit: Payer: Self-pay | Admitting: Obstetrics and Gynecology

## 2017-07-28 DIAGNOSIS — Z1231 Encounter for screening mammogram for malignant neoplasm of breast: Secondary | ICD-10-CM

## 2017-10-13 ENCOUNTER — Ambulatory Visit: Payer: Managed Care, Other (non HMO) | Admitting: Family Medicine

## 2017-10-13 ENCOUNTER — Ambulatory Visit: Payer: Self-pay | Admitting: Obstetrics and Gynecology

## 2017-10-16 ENCOUNTER — Ambulatory Visit: Payer: 59

## 2017-10-20 ENCOUNTER — Ambulatory Visit (INDEPENDENT_AMBULATORY_CARE_PROVIDER_SITE_OTHER): Payer: 59

## 2017-10-20 ENCOUNTER — Encounter: Payer: Self-pay | Admitting: Obstetrics and Gynecology

## 2017-10-20 ENCOUNTER — Ambulatory Visit (INDEPENDENT_AMBULATORY_CARE_PROVIDER_SITE_OTHER): Payer: 59 | Admitting: Obstetrics and Gynecology

## 2017-10-20 VITALS — BP 112/60 | HR 88 | Ht 64.0 in | Wt 146.0 lb

## 2017-10-20 DIAGNOSIS — Z1239 Encounter for other screening for malignant neoplasm of breast: Secondary | ICD-10-CM

## 2017-10-20 DIAGNOSIS — Z1151 Encounter for screening for human papillomavirus (HPV): Secondary | ICD-10-CM

## 2017-10-20 DIAGNOSIS — Z01419 Encounter for gynecological examination (general) (routine) without abnormal findings: Secondary | ICD-10-CM

## 2017-10-20 DIAGNOSIS — Z1231 Encounter for screening mammogram for malignant neoplasm of breast: Secondary | ICD-10-CM

## 2017-10-20 DIAGNOSIS — R102 Pelvic and perineal pain: Secondary | ICD-10-CM | POA: Diagnosis not present

## 2017-10-20 DIAGNOSIS — Z124 Encounter for screening for malignant neoplasm of cervix: Secondary | ICD-10-CM

## 2017-10-20 NOTE — Progress Notes (Signed)
PCP:  Roselee Nova, MD   Chief Complaint  Patient presents with  . Gynecologic Exam    pelvic bone pain x4-5 months     HPI:      [name] is a 41 y.o. G0P0000 who LMP was Patient's last menstrual period was 08/16/2016., presents today for her annual examination.  Her menses are absent due to lap hyst/salpingectomy for menorrhagia and leio 2017 with Dr. Kenton Kingfisher.  She does not have intermenstrual bleeding.  She complains of bilat hip bone pain intermittently for the past 4-5 months. Sx alternate between left and right. She does not exercise. No aggrav factors. Hip bones are not tender to palpate but she feels pain there.  Sex activity: single partner, contraception - status post hysterectomy.  Last Pap: October 06, 2014  Results were: no abnormalities /neg HPV DNA  Hx of STDs: none  Last mammogram: October 15, 2016  Results were: normal--routine follow-up in 12 months There is no FH of breast cancer. There is no FH of ovarian cancer. The patient does not do self-breast exams.  Tobacco use: The patient denies current or previous tobacco use. Alcohol use: social drinker No drug use.  Exercise: not active  She does get adequate calcium and Vitamin D in her diet.  Labs with PCP.   Past Medical History:  Diagnosis Date  . Acute abdominal pain in right lower quadrant 03/25/2016  . Anxiety disorder 09/27/2015  . Anxiety state, unspecified   . Chronic lymphocytic thyroiditis   . History of Crohn's disease   . Irritable bowel syndrome   . Other chest pain   . Other specified congenital anomaly of skin   . Sciatica   . Unspecified chronic bronchitis ([place])   . Unspecified sinusitis (chronic)   . Vaginitis and vulvovaginitis, unspecified     Past Surgical History:  Procedure Laterality Date  . ABDOMINAL HYSTERECTOMY    . COLONOSCOPY WITH PROPOFOL N/A 05/09/2016   Procedure: COLONOSCOPY WITH PROPOFOL;  Surgeon: Lucilla Lame, MD;  Location: [place];   Service: Endoscopy;  Laterality: N/A;  PLEASE LEAVE EARLY AS POSSIBLE  . CYSTOSCOPY N/A 08/20/2016   Procedure: CYSTOSCOPY;  Surgeon: Gae Dry, MD;  Location: [facility];  Service: Gynecology;  Laterality: N/A;  . LAPAROSCOPIC BILATERAL SALPINGECTOMY Bilateral 08/20/2016   Procedure: LAPAROSCOPIC BILATERAL SALPINGECTOMY;  Surgeon: Gae Dry, MD;  Location: [facility];  Service: Gynecology;  Laterality: Bilateral;  . LAPAROSCOPIC HYSTERECTOMY N/A 08/20/2016   Procedure: HYSTERECTOMY TOTAL LAPAROSCOPIC;  Surgeon: Gae Dry, MD;  Location: [facility];  Service: Gynecology;  Laterality: N/A;  . POLYPECTOMY  05/09/2016   Procedure: POLYPECTOMY;  Surgeon: Lucilla Lame, MD;  Location: [place];  Service: Endoscopy;;  . WISDOM TOOTH EXTRACTION      Family History  Problem Relation Age of Onset  . Hypertension Father   . Heart disease Father   . Heart attack Paternal Grandmother   . Breast cancer Neg Hx     Social History   Socioeconomic History  . Marital status: Single    Spouse name: Not on file  . Number of children: Not on file  . Years of education: Not on file  . Highest education level: Not on file  Social Needs  . Financial resource strain: Not on file  . Food insecurity - worry: Not on file  . Food insecurity - inability: Not on file  . Transportation needs - medical: Not on file  . Transportation  needs - non-medical: Not on file  Occupational History  . Not on file  Tobacco Use  . Smoking status: Never Smoker  . Smokeless tobacco: Never Used  Substance and Sexual Activity  . Alcohol use: Yes    Alcohol/week: 4.2 oz    Types: 7 Shots of liquor per week  . Drug use: No  . Sexual activity: Yes    Birth control/protection: Surgical  Other Topics Concern  . Not on file  Social History Narrative  . Not on file    Current Meds  Medication Sig  . ALPRAZolam (XANAX) 1 MG tablet Take 1 tablet (1 mg total) by mouth 3 (three) times daily as needed for  anxiety.  . Ascorbic Acid (VITAMIN C PO) Take 1 tablet by mouth daily.   . B Complex Vitamins (VITAMIN B-COMPLEX PO) Take 1 tablet by mouth daily.   . Cholecalciferol (VITAMIN D PO) Take 1 tablet by mouth daily.   . fluticasone (FLONASE) 50 MCG/ACT nasal spray Place 2 sprays into both nostrils daily.  Marland Kitchen levocetirizine (XYZAL) 5 MG tablet Take 5 mg by mouth as needed for allergies.  . Omega-3 Fatty Acids (OMEGA 3 PO) Take 1 capsule by mouth daily.      ROS:  Review of Systems  Constitutional: Negative for fatigue, fever and unexpected weight change.  Respiratory: Negative for cough, shortness of breath and wheezing.   Cardiovascular: Negative for chest pain, palpitations and leg swelling.  Gastrointestinal: Negative for blood in stool, constipation, diarrhea, nausea and vomiting.  Endocrine: Negative for cold intolerance, heat intolerance and polyuria.  Genitourinary: Negative for dyspareunia, dysuria, flank pain, frequency, genital sores, hematuria, menstrual problem, pelvic pain, urgency, vaginal bleeding, vaginal discharge and vaginal pain.  Musculoskeletal: Negative for back pain, joint swelling and myalgias.  Skin: Negative for rash.  Neurological: Negative for dizziness, syncope, light-headedness, numbness and headaches.  Hematological: Negative for adenopathy.  Psychiatric/Behavioral: Negative for agitation, confusion, sleep disturbance and suicidal ideas. The patient is not nervous/anxious.      Objective: BP 112/60 (BP Location: Left Arm, Patient Position: Sitting, Cuff Size: Normal)   Pulse 88   Ht 5' 4"  (1.626 m)   Wt 146 lb (66.2 kg)   LMP 08/16/2016   BMI 25.06 kg/m    Physical Exam  Constitutional: She is oriented to person, place, and time. She appears well-developed and well-nourished.  Genitourinary: Vagina normal and uterus normal. There is no rash or tenderness on the right labia. There is no rash or tenderness on the left labia. No erythema or tenderness in  the vagina. No vaginal discharge found. Right adnexum does not display mass and does not display tenderness. Left adnexum does not display mass and does not display tenderness. Cervix does not exhibit motion tenderness or polyp. Uterus is not enlarged or tender.  Neck: Normal range of motion. No thyromegaly present.  Cardiovascular: Normal rate, regular rhythm and normal heart sounds.  No murmur heard. Pulmonary/Chest: Effort normal and breath sounds normal. Right breast exhibits no mass, no nipple discharge, no skin change and no tenderness. Left breast exhibits no mass, no nipple discharge, no skin change and no tenderness.  Abdominal: Soft. There is no tenderness. There is no guarding.    TENDER TO DEEPLY PALPATE ABD MUSCLES NEAR RT HIP; NO MASSES  Musculoskeletal: Normal range of motion.  Neurological: She is alert and oriented to person, place, and time. No cranial nerve deficit.  Psychiatric: She has a normal mood and affect. Her behavior is normal.  Vitals reviewed.   Results: GYN U/S-->    Assessment/Plan: Encounter for annual routine gynecological examination  Cervical cancer screening - Plan: IGP, Aptima HPV  Screening for HPV (human papillomavirus) - Plan: IGP, Aptima HPV  Screening for breast cancer - Pt has mammo sched 1/19. - Plan: MM DIGITAL SCREENING BILATERAL  Pelvic pain - Neg GYN u/s. Most likely musculoskeletal given exam and sx. Exercise/stretch. F/u with PCP if sx persist.  - Plan: US PELVIS TRANSVANGINAL NON-OB (TV ONLY)   GYN counsel mammography screening, adequate intake of calcium and vitamin D, diet and exercise     F/U  Return in about 1 year (around 10/20/2018).  Haru Shaff B. Maalle Starrett, PA-C 10/21/2017 9:19 AM

## 2017-10-20 NOTE — Patient Instructions (Signed)
I value your feedback and entrusting us with your care. If you get a [facility] patient survey, I would appreciate you taking the time to let us know about your experience today. Thank you! 

## 2017-10-21 ENCOUNTER — Encounter: Payer: Self-pay | Admitting: Obstetrics and Gynecology

## 2017-10-22 LAB — IGP, APTIMA HPV
HPV Aptima: NEGATIVE
PAP Smear Comment: 0

## 2017-10-29 ENCOUNTER — Ambulatory Visit: Payer: 59

## 2017-10-30 ENCOUNTER — Encounter: Payer: Managed Care, Other (non HMO) | Admitting: Family Medicine

## 2017-11-13 ENCOUNTER — Encounter: Payer: Self-pay | Admitting: Obstetrics and Gynecology

## 2017-11-13 ENCOUNTER — Encounter: Payer: Self-pay | Admitting: Family Medicine

## 2017-11-13 ENCOUNTER — Ambulatory Visit (INDEPENDENT_AMBULATORY_CARE_PROVIDER_SITE_OTHER): Payer: 59 | Admitting: Family Medicine

## 2017-11-13 ENCOUNTER — Ambulatory Visit
Admission: RE | Admit: 2017-11-13 | Discharge: 2017-11-13 | Disposition: A | Discharge status: Home / Self Care | Payer: 59 | Source: Ambulatory Visit | Attending: Obstetrics and Gynecology | Admitting: Obstetrics and Gynecology

## 2017-11-13 VITALS — BP 108/74 | HR 90 | Temp 98.6°F | Resp 16 | Ht 64.0 in | Wt 141.8 lb

## 2017-11-13 DIAGNOSIS — Z1231 Encounter for screening mammogram for malignant neoplasm of breast: Secondary | ICD-10-CM | POA: Diagnosis present

## 2017-11-13 DIAGNOSIS — Z0001 Encounter for general adult medical examination with abnormal findings: Secondary | ICD-10-CM | POA: Diagnosis not present

## 2017-11-13 DIAGNOSIS — Z23 Encounter for immunization: Secondary | ICD-10-CM

## 2017-11-13 DIAGNOSIS — G4700 Insomnia, unspecified: Secondary | ICD-10-CM | POA: Diagnosis not present

## 2017-11-13 DIAGNOSIS — R05 Cough: Secondary | ICD-10-CM

## 2017-11-13 DIAGNOSIS — F411 Generalized anxiety disorder: Secondary | ICD-10-CM | POA: Diagnosis not present

## 2017-11-13 DIAGNOSIS — Z0000 Encounter for general adult medical examination without abnormal findings: Secondary | ICD-10-CM

## 2017-11-13 DIAGNOSIS — J3089 Other allergic rhinitis: Secondary | ICD-10-CM | POA: Diagnosis not present

## 2017-11-13 DIAGNOSIS — R059 Cough, unspecified: Secondary | ICD-10-CM

## 2017-11-13 LAB — POCT GLYCOSYLATED HEMOGLOBIN (HGB A1C): Hemoglobin A1C: 4.8

## 2017-11-13 MED ORDER — MOMETASONE FUROATE 50 MCG/ACT NA SUSP: 2.0000 | Freq: Every day | NASAL | 0 refills | Status: DC

## 2017-11-13 MED ORDER — MOMETASONE FUROATE 50 MCG/ACT NA SUSP: 2.0000 | Freq: Every day | NASAL | 12 refills | Status: DC

## 2017-11-13 MED ORDER — ALPRAZOLAM 1 MG PO TABS: 1.0000 mg | ORAL_TABLET | Freq: Every evening | ORAL | 2 refills | Status: DC | PRN

## 2017-11-13 MED ORDER — TRAZODONE HCL 50 MG PO TABS: 50.0000 mg | ORAL_TABLET | Freq: Every evening | ORAL | 0 refills | Status: DC | PRN

## 2017-11-13 MED ORDER — BENZONATATE 200 MG PO CAPS: 200.0000 mg | ORAL_CAPSULE | Freq: Two times a day (BID) | ORAL | 0 refills | Status: DC | PRN

## 2017-11-13 NOTE — Progress Notes (Signed)
Name: Betty Rodgers   MRN: [id number]    DOB: [birth year]   Date:11/13/2017       Progress Note  Subjective  Chief Complaint  Chief Complaint  Patient presents with  . Annual Exam    HPI  Pt. Presents for Annual Physical Exam.  She had a Pap Smear in December 2018. She is due for screening lab work.  Past Medical History:  Diagnosis Date  . Acute abdominal pain in right lower quadrant 03/25/2016  . Anxiety disorder 09/27/2015  . Anxiety state, unspecified   . Chronic lymphocytic thyroiditis   . History of Crohn's disease   . Irritable bowel syndrome   . Other chest pain   . Other specified congenital anomaly of skin   . Sciatica   . Unspecified chronic bronchitis ([place])   . Unspecified sinusitis (chronic)   . Vaginitis and vulvovaginitis, unspecified     Past Surgical History:  Procedure Laterality Date  . ABDOMINAL HYSTERECTOMY    . COLONOSCOPY WITH PROPOFOL N/A 05/09/2016   Procedure: COLONOSCOPY WITH PROPOFOL;  Surgeon: Lucilla Lame, MD;  Location: [place];  Service: Endoscopy;  Laterality: N/A;  PLEASE LEAVE EARLY AS POSSIBLE  . CYSTOSCOPY N/A 08/20/2016   Procedure: CYSTOSCOPY;  Surgeon: Gae Dry, MD;  Location: [facility];  Service: Gynecology;  Laterality: N/A;  . LAPAROSCOPIC BILATERAL SALPINGECTOMY Bilateral 08/20/2016   Procedure: LAPAROSCOPIC BILATERAL SALPINGECTOMY;  Surgeon: Gae Dry, MD;  Location: [facility];  Service: Gynecology;  Laterality: Bilateral;  . LAPAROSCOPIC HYSTERECTOMY N/A 08/20/2016   Procedure: HYSTERECTOMY TOTAL LAPAROSCOPIC;  Surgeon: Gae Dry, MD;  Location: [facility];  Service: Gynecology;  Laterality: N/A;  . POLYPECTOMY  05/09/2016   Procedure: POLYPECTOMY;  Surgeon: Lucilla Lame, MD;  Location: [place];  Service: Endoscopy;;  . WISDOM TOOTH EXTRACTION      Family History  Problem Relation Age of Onset  . Hypertension Father   . Heart disease Father   . Diabetes Father        Controlled  .  Heart attack Paternal Grandmother   . Breast cancer Neg Hx     Social History   Socioeconomic History  . Marital status: Single    Spouse name: Not on file  . Number of children: Not on file  . Years of education: Not on file  . Highest education level: Not on file  Social Needs  . Financial resource strain: Not on file  . Food insecurity - worry: Not on file  . Food insecurity - inability: Not on file  . Transportation needs - medical: Not on file  . Transportation needs - non-medical: Not on file  Occupational History  . Not on file  Tobacco Use  . Smoking status: Never Smoker  . Smokeless tobacco: Never Used  Substance and Sexual Activity  . Alcohol use: Yes    Alcohol/week: 4.2 oz    Types: 7 Shots of liquor per week  . Drug use: No  . Sexual activity: Yes    Partners: Male    Birth control/protection: Surgical    Comment: Hysterectomy  Other Topics Concern  . Not on file  Social History Narrative  . Not on file     Current Outpatient Medications:  .  ALPRAZolam (XANAX) 1 MG tablet, Take 1 tablet (1 mg total) by mouth 3 (three) times daily as needed for anxiety., Disp: 90 tablet, Rfl: 5 .  Ascorbic Acid (VITAMIN C PO), Take 1 tablet by mouth daily. ,  Disp: , Rfl:  .  B Complex Vitamins (VITAMIN B-COMPLEX PO), Take 1 tablet by mouth daily. , Disp: , Rfl:  .  levocetirizine (XYZAL) 5 MG tablet, Take 5 mg by mouth as needed for allergies., Disp: , Rfl:  .  Multiple Vitamin (MULTIVITAMIN) tablet, Take 1 tablet by mouth daily., Disp: , Rfl:  .  Omega-3 Fatty Acids (OMEGA 3 PO), Take 1 capsule by mouth daily. , Disp: , Rfl:  .  Cholecalciferol (VITAMIN D PO), Take 1 tablet by mouth daily. , Disp: , Rfl:  .  fluticasone (FLONASE) 50 MCG/ACT nasal spray, Place 2 sprays into both nostrils daily. (Patient not taking: Reported on 11/13/2017), Disp: 16 g, Rfl: 0  Allergies  Allergen Reactions  . Bee Venom      Review of Systems  Constitutional: Negative for chills,  fever and malaise/fatigue.  HENT: Positive for congestion and sinus pain. Negative for ear pain and sore throat.   Eyes: Negative for blurred vision and double vision.  Respiratory: Positive for cough and sputum production. Negative for shortness of breath and wheezing.   Cardiovascular: Negative for chest pain, palpitations and leg swelling.  Gastrointestinal: Negative for abdominal pain, blood in stool, constipation, diarrhea, nausea and vomiting.  Genitourinary: Negative for hematuria.  Musculoskeletal: Negative for neck pain.  Neurological: Negative for dizziness and headaches.  Psychiatric/Behavioral: Negative for depression. The patient is nervous/anxious (has been taking Xanax 65m at bedtime, tried to taper off to 0.25 mg but started having headache and neck pain and went back up to 1 mh) and has insomnia.     Objective  Vitals:   11/13/17 0856  BP: 108/74  Pulse: 90  Resp: 16  Temp: 98.6 F (37 C)  TempSrc: Oral  SpO2: 99%  Weight: 141 lb 12.8 oz (64.3 kg)  Height: 5' 4"  (1.626 m)    Physical Exam  Constitutional: She is oriented to person, place, and time and well-developed, well-nourished, and in no distress.  HENT:  Head: Normocephalic and atraumatic.  Mouth/Throat: Posterior oropharyngeal erythema present.  Cerumen impaction in ear canals Nasal mucosal inflammation R>L  Neck: Neck supple.  Cardiovascular: Normal rate, regular rhythm, S1 normal, S2 normal and normal heart sounds.  No murmur heard. Pulmonary/Chest: Effort normal and breath sounds normal. She has no wheezes.  Abdominal: Soft. Bowel sounds are normal. There is no tenderness.  Musculoskeletal: She exhibits no edema.       Right ankle: She exhibits no swelling.       Left ankle: She exhibits no swelling.  Neurological: She is alert and oriented to person, place, and time.  Psychiatric: Mood, memory, affect and judgment normal.  Nursing note and vitals reviewed.    Assessment & Plan  1. Need for  immunization against influenza  - Flu Vaccine QUAD 6+ mos PF IM (Fluarix Quad PF)  2. Annual physical exam Obtain age-appropriate laboratory screenings, point-of-care A1c is 4.8%, considered normal - CBC with Differential/Platelet - COMPLETE METABOLIC PANEL WITH GFR - Lipid panel - TSH - VITAMIN D 25 Hydroxy (Vit-D Deficiency, Fractures) - POCT glycosylated hemoglobin (Hb A1C)  3. Non-seasonal allergic rhinitis, unspecified trigger Rhinitis leading to postnasal drainage, we'll recommend mometasone nasal spray - mometasone (NASONEX) 50 MCG/ACT nasal spray; Place 2 sprays into the nose daily.  Dispense: 17 g; Refill: 0  4. Cough in adult patient Echo because of postnasal drainage, started on benzonatate twice daily for cough - benzonatate (TESSALON) 200 MG capsule; Take 1 capsule (200 mg total) by mouth  2 (two) times daily as needed for cough.  Dispense: 20 capsule; Refill: 0  5. Insomnia, unspecified type Vitals to start trazodone the day after she finishes tapering off alprazolam, reassess in 3 months - traZODone (DESYREL) 50 MG tablet; Take 1 tablet (50 mg total) by mouth at bedtime as needed for sleep.  Dispense: 30 tablet; Refill: 0  6. Generalized anxiety disorder Have advised patient to taper off by taking 0.5 mg for 2 weeks and then 0.25 mg for 2 weeks, this should minimize the Betty of withdrawal symptoms - ALPRAZolam (XANAX) 1 MG tablet; Take 1 tablet (1 mg total) by mouth at bedtime as needed for anxiety.  Dispense: 30 tablet; Refill: 2   Ona Rathert Asad A. [facility] 11/13/2017 9:09 AM

## 2017-11-14 LAB — COMPLETE METABOLIC PANEL WITH GFR
AG RATIO: 1.8 (calc) (ref 1.0–2.5)
ALKALINE PHOSPHATASE (APISO): 76 U/L (ref 33–115)
ALT: 11 U/L (ref 6–29)
AST: 13 U/L (ref 10–30)
Albumin: 4.6 g/dL (ref 3.6–5.1)
BUN: 18 mg/dL (ref 7–25)
CHLORIDE: 104 mmol/L (ref 98–110)
CO2: 25 mmol/L (ref 20–32)
Calcium: 9.1 mg/dL (ref 8.6–10.2)
Creat: 0.62 mg/dL (ref 0.50–1.10)
GFR, Est African American: 128 mL/min/{1.73_m2} (ref >=60)
GFR, Est Non African American: 110 mL/min/{1.73_m2} (ref >=60)
GLOBULIN: 2.5 g/dL (ref 1.9–3.7)
Glucose, Bld: 77 mg/dL (ref 65–99)
POTASSIUM: 4.1 mmol/L (ref 3.5–5.3)
SODIUM: 137 mmol/L (ref 135–146)
Total Bilirubin: 0.5 mg/dL (ref 0.2–1.2)
Total Protein: 7.1 g/dL (ref 6.1–8.1)

## 2017-11-14 LAB — LIPID PANEL
CHOLESTEROL: 168 mg/dL (ref <200)
HDL: 84 mg/dL (ref >50)
LDL Cholesterol (Calc): 68 mg/dL (calc)
Non-HDL Cholesterol (Calc): 84 mg/dL (calc) (ref <130)
Total CHOL/HDL Ratio: 2 (calc) (ref <5.0)
Triglycerides: 77 mg/dL (ref <150)

## 2017-11-14 LAB — CBC WITH DIFFERENTIAL/PLATELET
BASOS ABS: 47 {cells}/uL (ref 0–200)
Basophils Relative: 0.5 %
EOS PCT: 1.4 %
Eosinophils Absolute: 130 cells/uL (ref 15–500)
HEMATOCRIT: 40.5 % (ref 35.0–45.0)
Hemoglobin: 14 g/dL (ref 11.7–15.5)
LYMPHS ABS: 2595 {cells}/uL (ref 850–3900)
MCH: 30.2 pg (ref 27.0–33.0)
MCHC: 34.6 g/dL (ref 32.0–36.0)
MCV: 87.3 fL (ref 80.0–100.0)
MPV: 11 fL (ref 7.5–12.5)
Monocytes Relative: 5 %
NEUTROS PCT: 65.2 %
Neutro Abs: 6064 cells/uL (ref 1500–7800)
Platelets: 324 10*3/uL (ref 140–400)
RBC: 4.64 10*6/uL (ref 3.80–5.10)
RDW: 11.7 % (ref 11.0–15.0)
Total Lymphocyte: 27.9 %
WBC mixed population: 465 cells/uL (ref 200–950)
WBC: 9.3 10*3/uL (ref 3.8–10.8)

## 2017-11-14 LAB — TSH: TSH: 2.12 mIU/L

## 2017-11-14 LAB — VITAMIN D 25 HYDROXY (VIT D DEFICIENCY, FRACTURES): Vit D, 25-Hydroxy: 27 ng/mL — ABNORMAL LOW (ref 30–100)

## 2017-11-18 ENCOUNTER — Other Ambulatory Visit: Payer: Self-pay

## 2017-11-18 DIAGNOSIS — E559 Vitamin D deficiency, unspecified: Secondary | ICD-10-CM

## 2017-11-18 MED ORDER — VITAMIN D (ERGOCALCIFEROL) 1.25 MG (50000 UNIT) PO CAPS: 50000.0000 [IU] | ORAL_CAPSULE | ORAL | 0 refills | Status: DC

## 2018-02-10 ENCOUNTER — Encounter: Payer: Self-pay | Admitting: Obstetrics and Gynecology

## 2018-02-11 ENCOUNTER — Ambulatory Visit (INDEPENDENT_AMBULATORY_CARE_PROVIDER_SITE_OTHER): Payer: 59 | Admitting: Obstetrics and Gynecology

## 2018-02-11 ENCOUNTER — Encounter: Payer: Self-pay | Admitting: Obstetrics and Gynecology

## 2018-02-11 VITALS — BP 138/84 | HR 120 | Temp 99.3°F | Ht 64.0 in | Wt 138.0 lb

## 2018-02-11 DIAGNOSIS — R42 Dizziness and giddiness: Secondary | ICD-10-CM | POA: Insufficient documentation

## 2018-02-11 DIAGNOSIS — R509 Fever, unspecified: Secondary | ICD-10-CM

## 2018-02-11 DIAGNOSIS — N898 Other specified noninflammatory disorders of vagina: Secondary | ICD-10-CM | POA: Diagnosis not present

## 2018-02-11 DIAGNOSIS — N3000 Acute cystitis without hematuria: Secondary | ICD-10-CM | POA: Diagnosis not present

## 2018-02-11 DIAGNOSIS — R197 Diarrhea, unspecified: Secondary | ICD-10-CM | POA: Insufficient documentation

## 2018-02-11 LAB — POCT URINALYSIS DIPSTICK
Bilirubin, UA: NEGATIVE
Blood, UA: NEGATIVE
GLUCOSE UA: NEGATIVE
Ketones, UA: NEGATIVE
Nitrite, UA: NEGATIVE
Protein, UA: NEGATIVE
Spec Grav, UA: 1.01 (ref 1.010–1.025)
pH, UA: 6 (ref 5.0–8.0)

## 2018-02-11 LAB — POCT WET PREP WITH KOH
Clue Cells Wet Prep HPF POC: NEGATIVE
KOH Prep POC: NEGATIVE
Trichomonas, UA: NEGATIVE
Yeast Wet Prep HPF POC: NEGATIVE

## 2018-02-11 MED ORDER — FLUCONAZOLE 150 MG PO TABS: 150.0000 mg | ORAL_TABLET | Freq: Once | ORAL | 0 refills | Status: DC

## 2018-02-11 MED ORDER — NITROFURANTOIN MONOHYD MACRO 100 MG PO CAPS: 100.0000 mg | ORAL_CAPSULE | Freq: Two times a day (BID) | ORAL | 0 refills | Status: DC

## 2018-02-11 NOTE — Progress Notes (Signed)
Betty Nova, MD   Chief Complaint  Patient presents with  . Vaginitis    HPI:      Betty Rodgers is a 42 y.o. [id number] who LMP was Patient's last menstrual period was 08/16/2016., presents today for several sx. Pt has had urinary frequency/urgency/suprapubic discomfort since last night. No hematuria/dysuria. Has also had vag irritation/itch without d/c, odor since then, too. Pt hasn't felt well the past couple wks. Has been having 1-3 loose stools/diarrhea daily. Hx of IBS and under increased stress at work, so thought it was related. No meds taken. Did labs/UA at work 2 days ago that was neg except WBCs in urine.  Pt had very sore calf muscles for no reason for 4 days last wk. Also has been very dizzy, especially with changing head positions. Has had allergy congestion and sx. Not taking any allergy meds.  Pt's PCP left and we are now out-of-network.    Past Medical History:  Diagnosis Date  . Acute abdominal pain in right lower quadrant 03/25/2016  . Anxiety disorder 09/27/2015  . Anxiety state, unspecified   . Chronic lymphocytic thyroiditis   . History of Crohn's disease   . Irritable bowel syndrome   . Other chest pain   . Other specified congenital anomaly of skin   . Sciatica   . Unspecified chronic bronchitis ([place])   . Unspecified sinusitis (chronic)   . Vaginitis and vulvovaginitis, unspecified     Past Surgical History:  Procedure Laterality Date  . ABDOMINAL HYSTERECTOMY    . COLONOSCOPY WITH PROPOFOL N/A 05/09/2016   Procedure: COLONOSCOPY WITH PROPOFOL;  Surgeon: Lucilla Lame, MD;  Location: [facility];  Service: Endoscopy;  Laterality: N/A;  PLEASE LEAVE EARLY AS POSSIBLE  . CYSTOSCOPY N/A 08/20/2016   Procedure: CYSTOSCOPY;  Surgeon: Gae Dry, MD;  Location: [facility];  Service: Gynecology;  Laterality: N/A;  . LAPAROSCOPIC BILATERAL SALPINGECTOMY Bilateral 08/20/2016   Procedure: LAPAROSCOPIC BILATERAL SALPINGECTOMY;  Surgeon: Gae Dry, MD;  Location: [facility];  Service: Gynecology;  Laterality: Bilateral;  . LAPAROSCOPIC HYSTERECTOMY N/A 08/20/2016   Procedure: HYSTERECTOMY TOTAL LAPAROSCOPIC;  Surgeon: Gae Dry, MD;  Location: [facility];  Service: Gynecology;  Laterality: N/A;  . POLYPECTOMY  05/09/2016   Procedure: POLYPECTOMY;  Surgeon: Lucilla Lame, MD;  Location: [facility];  Service: Endoscopy;;  . WISDOM TOOTH EXTRACTION      Family History  Problem Relation Age of Onset  . Hypertension Father   . Heart disease Father   . Diabetes Father        Controlled  . Heart attack Paternal Grandmother   . Breast cancer Neg Hx     Social History   Socioeconomic History  . Marital status: Single    Spouse name: Not on file  . Number of children: Not on file  . Years of education: Not on file  . Highest education level: Not on file  Occupational History  . Not on file  Social Needs  . Financial resource strain: Not on file  . Food insecurity:    Worry: Not on file    Inability: Not on file  . Transportation needs:    Medical: Not on file    Non-medical: Not on file  Tobacco Use  . Smoking status: Never Smoker  . Smokeless tobacco: Never Used  Substance and Sexual Activity  . Alcohol use: Yes    Alcohol/week: 4.2 oz    Types: 7 Shots  of liquor per week  . Drug use: No  . Sexual activity: Yes    Partners: Male    Birth control/protection: Surgical    Comment: Hysterectomy  Lifestyle  . Physical activity:    Days per week: 1 day    Minutes per session: 60 min  . Stress: To some extent  Relationships  . Social connections:    Talks on phone: More than three times a week    Gets together: Three times a week    Attends religious service: Never    Active member of club or organization: No    Attends meetings of clubs or organizations: Never    Relationship status: Married  . Intimate partner violence:    Fear of current or ex partner: No    Emotionally abused: No    Physically  abused: No    Forced sexual activity: No  Other Topics Concern  . Not on file  Social History Narrative  . Not on file    Outpatient Medications Prior to Visit  Medication Sig Dispense Refill  . ALPRAZolam (XANAX) 1 MG tablet Take 1 tablet (1 mg total) by mouth at bedtime as needed for anxiety. 30 tablet 2  . Ascorbic Acid (VITAMIN C PO) Take 1 tablet by mouth daily.     . B Complex Vitamins (VITAMIN B-COMPLEX PO) Take 1 tablet by mouth daily.     . Cholecalciferol (VITAMIN D PO) Take 1 tablet by mouth daily.     . Multiple Vitamin (MULTIVITAMIN) tablet Take 1 tablet by mouth daily.    . Omega-3 Fatty Acids (OMEGA 3 PO) Take 1 capsule by mouth daily.     . Vitamin D, Ergocalciferol, (DRISDOL) 50000 units CAPS capsule Take 1 capsule (50,000 Units total) by mouth every 7 (seven) days. For 12 weeks. 12 capsule 0  . benzonatate (TESSALON) 200 MG capsule Take 1 capsule (200 mg total) by mouth 2 (two) times daily as needed for cough. (Patient not taking: Reported on 02/11/2018) 20 capsule 0  . fluticasone (FLONASE) 50 MCG/ACT nasal spray Place 2 sprays into both nostrils daily. (Patient not taking: Reported on 11/13/2017) 16 g 0  . levocetirizine (XYZAL) 5 MG tablet Take 5 mg by mouth as needed for allergies.    . mometasone (NASONEX) 50 MCG/ACT nasal spray Place 2 sprays into the nose daily. (Patient not taking: Reported on 02/11/2018) 17 g 0  . traZODone (DESYREL) 50 MG tablet Take 1 tablet (50 mg total) by mouth at bedtime as needed for sleep. (Patient not taking: Reported on 02/11/2018) 30 tablet 0   No facility-administered medications prior to visit.      ROS:  Review of Systems  Constitutional: Negative for fatigue, fever and unexpected weight change.  HENT: Positive for congestion, rhinorrhea and sneezing. Negative for ear discharge and ear pain.   Respiratory: Negative for cough, shortness of breath and wheezing.   Cardiovascular: Negative for chest pain, palpitations and leg  swelling.  Gastrointestinal: Positive for diarrhea. Negative for blood in stool, constipation, nausea and vomiting.  Endocrine: Negative for cold intolerance, heat intolerance and polyuria.  Genitourinary: Positive for frequency, pelvic pain and urgency. Negative for dyspareunia, dysuria, flank pain, genital sores, hematuria, menstrual problem, vaginal bleeding, vaginal discharge and vaginal pain.  Musculoskeletal: Negative for back pain, joint swelling and myalgias.  Skin: Negative for rash.  Neurological: Positive for dizziness. Negative for syncope, light-headedness, numbness and headaches.  Hematological: Negative for adenopathy.  Psychiatric/Behavioral: Negative for agitation, confusion, sleep disturbance  and suicidal ideas. The patient is not nervous/anxious.    BREAST: No symptoms   OBJECTIVE:   Vitals:  BP 138/84   Pulse (!) 120   Temp 99.3 F (37.4 C)   Ht 5' 4"  (1.626 m)   Wt 138 lb (62.6 kg)   LMP 08/16/2016   BMI 23.69 kg/m   Physical Exam  Constitutional: She is oriented to person, place, and time. Vital signs are normal. She appears well-developed.  HENT:  UNABLE TO SEE TM DUE TO CERUMEN IMPACTION  Pulmonary/Chest: Effort normal.  Abdominal: Soft. She exhibits no distension and no mass. There is no tenderness. There is no guarding.  Genitourinary: Vagina normal. There is no rash, tenderness or lesion on the right labia. There is no rash, tenderness or lesion on the left labia. Right adnexum displays no mass and no tenderness. Left adnexum displays no mass and no tenderness. No erythema or tenderness in the vagina. No vaginal discharge found.  Genitourinary Comments: UTERUS/CX SURG ABSENT  Musculoskeletal: Normal range of motion.  Neurological: She is alert and oriented to person, place, and time. No cranial nerve deficit. Coordination normal.  Psychiatric: She has a normal mood and affect. Her behavior is normal. Thought content normal.  Vitals  reviewed.   Results: Results for orders placed or performed in visit on 02/11/18 (from the past 24 hour(s))  POCT Urinalysis Dipstick     Status: Abnormal   Collection Time: 02/11/18 12:10 PM  Result Value Ref Range   Color, UA straw    Clarity, UA clear    Glucose, UA neg    Bilirubin, UA neg    Ketones, UA neg    Spec Grav, UA 1.010 1.010 - 1.025   Blood, UA neg    pH, UA 6.0 5.0 - 8.0   Protein, UA neg    Urobilinogen, UA  0.2 or 1.0 E.U./dL   Nitrite, UA neg    Leukocytes, UA Moderate (2+) (A) Negative   Appearance     Odor    POCT Wet Prep with KOH     Status: Normal   Collection Time: 02/11/18 12:10 PM  Result Value Ref Range   Trichomonas, UA Negative    Clue Cells Wet Prep HPF POC neg    Epithelial Wet Prep HPF POC  Few, Moderate, Many, Too numerous to count   Yeast Wet Prep HPF POC neg    Bacteria Wet Prep HPF POC  Few   RBC Wet Prep HPF POC     WBC Wet Prep HPF POC     KOH Prep POC Negative Negative     Assessment/Plan: Acute cystitis without hematuria - Pos dip/sx. Check C&S. Rx macrobid. F/u prn. If sx persist and C&S neg, f/u with PCP. - Plan: POCT Urinalysis Dipstick, Urine Culture, nitrofurantoin, macrocrystal-monohydrate, (MACROBID) 100 MG capsule  Vaginal irritation - Neg wet prep. Rx diflucan prn.  - Plan: fluconazole (DIFLUCAN) 150 MG tablet, POCT Wet Prep with KOH  Dizziness - Prob vertigo, question related to seasonal allergies/congestion. Unable to see TM. Try claritin/zyrtec. F/u wiht PCP prn.  Diarrhea, unspecified type - Question IBS. Try immodium. F/u with PCP prn.  Low grade fever    Meds ordered this encounter  Medications  . nitrofurantoin, macrocrystal-monohydrate, (MACROBID) 100 MG capsule    Sig: Take 1 capsule (100 mg total) by mouth 2 (two) times daily for 7 days.    Dispense:  14 capsule    Refill:  0    Order Specific Question:  Supervising Provider    Answer:   Gae Dry [[id number]]  . fluconazole (DIFLUCAN) 150 MG  tablet    Sig: Take 1 tablet (150 mg total) by mouth once for 1 dose.    Dispense:  1 tablet    Refill:  0    Order Specific Question:   Supervising Provider    Answer:   Gae Dry [[id number]]      Return if symptoms worsen or fail to improve.  Alicia B. Copland, PA-C 02/11/2018 12:14 PM

## 2018-02-11 NOTE — Patient Instructions (Signed)
I value your feedback and entrusting us with your care. If you get a [facility] patient survey, I would appreciate you taking the time to let us know about your experience today. Thank you! 

## 2018-02-13 ENCOUNTER — Encounter: Payer: Self-pay | Admitting: Obstetrics and Gynecology

## 2018-02-13 LAB — URINE CULTURE

## 2018-04-05 ENCOUNTER — Encounter: Payer: Self-pay | Admitting: Obstetrics and Gynecology

## 2018-04-06 ENCOUNTER — Encounter: Payer: Self-pay | Admitting: Obstetrics and Gynecology

## 2018-04-06 ENCOUNTER — Other Ambulatory Visit: Payer: Self-pay | Admitting: Obstetrics and Gynecology

## 2018-04-06 DIAGNOSIS — N3000 Acute cystitis without hematuria: Secondary | ICD-10-CM

## 2018-04-06 MED ORDER — NITROFURANTOIN MONOHYD MACRO 100 MG PO CAPS: 100.0000 mg | ORAL_CAPSULE | Freq: Two times a day (BID) | ORAL | 0 refills | Status: DC

## 2018-04-06 NOTE — Telephone Encounter (Signed)
Pt states she has been having UTI symptoms since Friday & is requesting a rx to be sent to [facility].

## 2018-04-06 NOTE — Progress Notes (Signed)
Rx RF due to UTI sx. Pt can't come to office for appt/specimen drop off. RTO if sx persist/recur.

## 2018-04-30 ENCOUNTER — Ambulatory Visit: Payer: 59 | Admitting: Family Medicine

## 2018-05-06 ENCOUNTER — Encounter: Payer: Self-pay | Admitting: Obstetrics and Gynecology

## 2018-05-08 ENCOUNTER — Other Ambulatory Visit: Payer: Self-pay | Admitting: Obstetrics and Gynecology

## 2018-05-08 MED ORDER — FLUCONAZOLE 150 MG PO TABS: 150.0000 mg | ORAL_TABLET | Freq: Once | ORAL | 0 refills | Status: DC

## 2018-05-08 NOTE — Progress Notes (Signed)
Rx diflucan for yeast vag

## 2018-05-10 ENCOUNTER — Ambulatory Visit
Admission: EM | Admit: 2018-05-10 | Discharge: 2018-05-10 | Disposition: A | Discharge status: Home / Self Care | Payer: 59

## 2018-05-26 ENCOUNTER — Encounter: Payer: Self-pay | Admitting: Obstetrics and Gynecology

## 2018-05-26 ENCOUNTER — Other Ambulatory Visit: Payer: Self-pay | Admitting: Obstetrics and Gynecology

## 2018-05-26 MED ORDER — FLUCONAZOLE 150 MG PO TABS: 150.0000 mg | ORAL_TABLET | Freq: Once | ORAL | 0 refills | Status: DC

## 2018-05-26 NOTE — Progress Notes (Signed)
Rx RF for lingering yeast vag sx after abx.

## 2018-06-18 ENCOUNTER — Encounter: Payer: Self-pay | Admitting: Obstetrics and Gynecology

## 2018-07-07 ENCOUNTER — Encounter: Payer: Self-pay | Admitting: Obstetrics and Gynecology

## 2018-07-08 ENCOUNTER — Other Ambulatory Visit: Payer: Self-pay | Admitting: Obstetrics and Gynecology

## 2018-07-08 MED ORDER — FLUCONAZOLE 150 MG PO TABS: 150.0000 mg | ORAL_TABLET | Freq: Once | ORAL | 0 refills | Status: AC

## 2018-07-08 NOTE — Progress Notes (Signed)
Rx RF. 

## 2018-10-08 ENCOUNTER — Other Ambulatory Visit: Payer: Self-pay | Admitting: Obstetrics and Gynecology

## 2018-10-08 ENCOUNTER — Encounter: Payer: Self-pay | Admitting: Obstetrics and Gynecology

## 2018-10-08 DIAGNOSIS — N3000 Acute cystitis without hematuria: Secondary | ICD-10-CM

## 2018-10-08 MED ORDER — NITROFURANTOIN MONOHYD MACRO 100 MG PO CAPS: 100.0000 mg | ORAL_CAPSULE | Freq: Two times a day (BID) | ORAL | 0 refills | Status: DC

## 2018-10-08 NOTE — Progress Notes (Signed)
Rx RF macrobid for UTI sx. [place] in [place].

## 2019-03-06 DIAGNOSIS — S01112A Laceration without foreign body of left eyelid and periocular area, initial encounter: Secondary | ICD-10-CM | POA: Diagnosis not present

## 2019-03-19 ENCOUNTER — Telehealth: Payer: BC Managed Care – PPO | Admitting: Family

## 2019-03-19 DIAGNOSIS — B3731 Acute candidiasis of vulva and vagina: Secondary | ICD-10-CM

## 2019-03-19 DIAGNOSIS — B373 Candidiasis of vulva and vagina: Secondary | ICD-10-CM | POA: Diagnosis not present

## 2019-03-19 MED ORDER — FLUCONAZOLE 150 MG PO TABS: 150.0000 mg | ORAL_TABLET | Freq: Once | ORAL | 0 refills | Status: DC

## 2019-03-19 NOTE — Progress Notes (Signed)
Greater than 5 minutes, yet less than 10 minutes of time have been spent researching, coordinating, and implementing care for this patient today.  Thank you for the details you included in the comment boxes. Those details are very helpful in determining the best course of treatment for you and help us to provide the best care.  We are sorry that you are not feeling well. Here is how we plan to help! Based on what you shared with me it looks like you: May have a yeast vaginosis  Vaginosis is an inflammation of the vagina that can result in discharge, itching and pain. The cause is usually a change in the normal balance of vaginal bacteria or an infection. Vaginosis can also result from reduced estrogen levels after menopause.  The most common causes of vaginosis are:   Bacterial vaginosis which results from an overgrowth of one on several organisms that are normally present in your vagina.   Yeast infections which are caused by a naturally occurring fungus called candida.   Vaginal atrophy (atrophic vaginosis) which results from the thinning of the vagina from reduced estrogen levels after menopause.   Trichomoniasis which is caused by a parasite and is commonly transmitted by sexual intercourse.  Factors that increase your risk of developing vaginosis include: . Medications, such as antibiotics and steroids . Uncontrolled diabetes . Use of hygiene products such as bubble bath, vaginal spray or vaginal deodorant . Douching . Wearing damp or tight-fitting clothing . Using an intrauterine device (IUD) for birth control . Hormonal changes, such as those associated with pregnancy, birth control pills or menopause . Sexual activity . Having a sexually transmitted infection  Your treatment plan is A single Diflucan (fluconazole) 150mg tablet once.  I have electronically sent this prescription into the pharmacy that you have chosen.  Be sure to take all of the medication as directed. Stop  taking any medication if you develop a rash, tongue swelling or shortness of breath. Mothers who are breast feeding should consider pumping and discarding their breast milk while on these antibiotics. However, there is no consensus that infant exposure at these doses would be harmful.  Remember that medication creams can weaken latex condoms. .   HOME CARE:  Good hygiene may prevent some types of vaginosis from recurring and may relieve some symptoms:  . Avoid baths, hot tubs and whirlpool spas. Rinse soap from your outer genital area after a shower, and dry the area well to prevent irritation. Don't use scented or harsh soaps, such as those with deodorant or antibacterial action. . Avoid irritants. These include scented tampons and pads. . Wipe from front to back after using the toilet. Doing so avoids spreading fecal bacteria to your vagina.  Other things that may help prevent vaginosis include:  . Don't douche. Your vagina doesn't require cleansing other than normal bathing. Repetitive douching disrupts the normal organisms that reside in the vagina and can actually increase your risk of vaginal infection. Douching won't clear up a vaginal infection. . Use a latex condom. Both female and female latex condoms may help you avoid infections spread by sexual contact. . Wear cotton underwear. Also wear pantyhose with a cotton crotch. If you feel comfortable without it, skip wearing underwear to bed. Yeast thrives in moist environments Your symptoms should improve in the next day or two.  GET HELP RIGHT AWAY IF:  . You have pain in your lower abdomen ( pelvic area or over your ovaries) . You develop nausea   or vomiting . You develop a fever . Your discharge changes or worsens . You have persistent pain with intercourse . You develop shortness of breath, a rapid pulse, or you faint.  These symptoms could be signs of problems or infections that need to be evaluated by a medical provider  now.  MAKE SURE YOU    Understand these instructions.  Will watch your condition.  Will get help right away if you are not doing well or get worse.  Your e-visit answers were reviewed by a board certified advanced clinical practitioner to complete your personal care plan. Depending upon the condition, your plan could have included both over the counter or prescription medications. Please review your pharmacy choice to make sure that you have choses a pharmacy that is open for you to pick up any needed prescription, Your safety is important to us. If you have drug allergies check your prescription carefully.   You can use MyChart to ask questions about today's visit, request a non-urgent call back, or ask for a work or school excuse for 24 hours related to this e-Visit. If it has been greater than 24 hours you will need to follow up with your provider, or enter a new e-Visit to address those concerns. You will get a MyChart message within the next two days asking about your experience. I hope that your e-visit has been valuable and will speed your recovery.  

## 2019-05-01 DIAGNOSIS — B349 Viral infection, unspecified: Secondary | ICD-10-CM | POA: Diagnosis not present

## 2019-05-01 DIAGNOSIS — Z20828 Contact with and (suspected) exposure to other viral communicable diseases: Secondary | ICD-10-CM | POA: Diagnosis not present

## 2019-07-26 ENCOUNTER — Other Ambulatory Visit: Payer: Self-pay | Admitting: Obstetrics and Gynecology

## 2019-07-26 ENCOUNTER — Encounter: Payer: Self-pay | Admitting: Obstetrics and Gynecology

## 2019-07-26 DIAGNOSIS — B3731 Acute candidiasis of vulva and vagina: Secondary | ICD-10-CM

## 2019-07-26 DIAGNOSIS — B373 Candidiasis of vulva and vagina: Secondary | ICD-10-CM

## 2019-07-26 MED ORDER — FLUCONAZOLE 150 MG PO TABS: 150.0000 mg | ORAL_TABLET | Freq: Once | ORAL | 0 refills | Status: DC

## 2019-07-26 NOTE — Progress Notes (Signed)
Rx diflucan for yeast vag sx. Hx in past

## 2019-07-29 ENCOUNTER — Encounter: Payer: Self-pay | Admitting: Obstetrics and Gynecology

## 2019-08-19 DIAGNOSIS — Z20828 Contact with and (suspected) exposure to other viral communicable diseases: Secondary | ICD-10-CM | POA: Diagnosis not present

## 2019-08-19 DIAGNOSIS — J00 Acute nasopharyngitis [common cold]: Secondary | ICD-10-CM | POA: Diagnosis not present

## 2019-08-26 ENCOUNTER — Telehealth: Payer: Self-pay | Admitting: Gastroenterology

## 2019-08-26 ENCOUNTER — Other Ambulatory Visit: Payer: Self-pay

## 2019-08-26 NOTE — Telephone Encounter (Signed)
Pt notified she will be due for her repeat colonoscopy in 05/2021.

## 2019-08-26 NOTE — Telephone Encounter (Signed)
Pt left vm she had a procedure with Dr. Allen Norris a few years back she wanted to confirm when he wanted her to come back for another please call pt

## 2019-08-31 ENCOUNTER — Ambulatory Visit: Payer: 59 | Admitting: Obstetrics and Gynecology

## 2019-09-07 NOTE — Progress Notes (Signed)
PCP:  Patient, No Pcp Per   Chief Complaint  Patient presents with  . Gynecologic Exam     HPI:      Betty Rodgers is a 43 y.o. G0P0000 who LMP was Patient's last menstrual period was 08/16/2016., presents today for her annual examination.  Her menses are absent due to lap hyst/salpingectomy for menorrhagia and leio 2017 with Dr. Kenton Kingfisher.  She does not have intermenstrual bleeding. No vasomotor sx.  Sex activity: single partner, contraception - status post hysterectomy. No vaginal dryness with sex. Has noted scant amt pink d/c right after sex. No pain, doesn't know where coming from. Not using lubricants. No bleeding if not sex active. Last Pap: 10/20/17  Results were: no abnormalities /neg HPV DNA. Pt concerned about HPV and wants repeat pap today, even though she knows it's not due this yr/recommended further with hyst. Hx of STDs: none  Last mammogram: 11/13/17  Results were: normal--routine follow-up in 12 months There is no FH of breast cancer. There is no FH of ovarian cancer. Her biological mom died last yr from cancer (unknown primary site) in her 83s. The patient does not do self-breast exams.  Tobacco use: The patient denies current or previous tobacco use. Alcohol use: social drinker No drug use.  Exercise: mod active  She does get adequate calcium and Vitamin D in her diet.  Multiple UTIs/yeast vag last yr. No recent sx.  Labs with PCP.   Past Medical History:  Diagnosis Date  . Acute abdominal pain in right lower quadrant 03/25/2016  . Anxiety disorder 09/27/2015  . Anxiety state, unspecified   . Chronic lymphocytic thyroiditis   . History of Crohn's disease   . Irritable bowel syndrome   . Other chest pain   . Other specified congenital anomaly of skin   . Sciatica   . Unspecified chronic bronchitis ([place])   . Unspecified sinusitis (chronic)   . Vaginitis and vulvovaginitis, unspecified     Past Surgical History:  Procedure Laterality Date  .  ABDOMINAL HYSTERECTOMY    . COLONOSCOPY WITH PROPOFOL N/A 05/09/2016   Procedure: COLONOSCOPY WITH PROPOFOL;  Surgeon: Lucilla Lame, MD;  Location: [facility];  Service: Endoscopy;  Laterality: N/A;  PLEASE LEAVE EARLY AS POSSIBLE  . CYSTOSCOPY N/A 08/20/2016   Procedure: CYSTOSCOPY;  Surgeon: Gae Dry, MD;  Location: [facility];  Service: Gynecology;  Laterality: N/A;  . LAPAROSCOPIC BILATERAL SALPINGECTOMY Bilateral 08/20/2016   Procedure: LAPAROSCOPIC BILATERAL SALPINGECTOMY;  Surgeon: Gae Dry, MD;  Location: [facility];  Service: Gynecology;  Laterality: Bilateral;  . LAPAROSCOPIC HYSTERECTOMY N/A 08/20/2016   Procedure: HYSTERECTOMY TOTAL LAPAROSCOPIC;  Surgeon: Gae Dry, MD;  Location: [facility];  Service: Gynecology;  Laterality: N/A;  . POLYPECTOMY  05/09/2016   Procedure: POLYPECTOMY;  Surgeon: Lucilla Lame, MD;  Location: [facility];  Service: Endoscopy;;  . WISDOM TOOTH EXTRACTION      Family History  Problem Relation Age of Onset  . Hypertension Father   . Heart disease Father   . Diabetes Father        Controlled  . Heart attack Paternal Grandmother   . Breast cancer Neg Hx     Social History   Socioeconomic History  . Marital status: Single    Spouse name: Not on file  . Number of children: Not on file  . Years of education: Not on file  . Highest education level: Not on file  Occupational History  . Not  on file  Social Needs  . Financial resource strain: Not on file  . Food insecurity    Worry: Not on file    Inability: Not on file  . Transportation needs    Medical: Not on file    Non-medical: Not on file  Tobacco Use  . Smoking status: Never Smoker  . Smokeless tobacco: Never Used  Substance and Sexual Activity  . Alcohol use: Yes    Alcohol/week: 7.0 standard drinks    Types: 7 Shots of liquor per week  . Drug use: No  . Sexual activity: Yes    Partners: Male    Birth control/protection: Surgical    Comment:  Hysterectomy  Lifestyle  . Physical activity    Days per week: 1 day    Minutes per session: 60 min  . Stress: To some extent  Relationships  . Social connections    Talks on phone: More than three times a week    Gets together: Three times a week    Attends religious service: Never    Active member of club or organization: No    Attends meetings of clubs or organizations: Never    Relationship status: Married  . Intimate partner violence    Fear of current or ex partner: No    Emotionally abused: No    Physically abused: No    Forced sexual activity: No  Other Topics Concern  . Not on file  Social History Narrative  . Not on file    Current Meds  Medication Sig  . ALPRAZolam (XANAX) 1 MG tablet Take 1 tablet (1 mg total) by mouth at bedtime as needed for anxiety.  . Ascorbic Acid (VITAMIN C PO) Take 1 tablet by mouth daily.   . B Complex Vitamins (VITAMIN B-COMPLEX PO) Take 1 tablet by mouth daily.   . Cholecalciferol (VITAMIN D PO) Take 1 tablet by mouth daily.   . Multiple Vitamin (MULTIVITAMIN) tablet Take 1 tablet by mouth daily.  . Omega-3 Fatty Acids (OMEGA 3 PO) Take 1 capsule by mouth daily.      ROS:  Review of Systems  Constitutional: Negative for fatigue, fever and unexpected weight change.  Respiratory: Negative for cough, shortness of breath and wheezing.   Cardiovascular: Negative for chest pain, palpitations and leg swelling.  Gastrointestinal: Negative for blood in stool, constipation, diarrhea, nausea and vomiting.  Endocrine: Negative for cold intolerance, heat intolerance and polyuria.  Genitourinary: Positive for vaginal bleeding. Negative for dyspareunia, dysuria, flank pain, frequency, genital sores, hematuria, menstrual problem, pelvic pain, urgency, vaginal discharge and vaginal pain.  Musculoskeletal: Negative for back pain, joint swelling and myalgias.  Skin: Negative for rash.  Neurological: Negative for dizziness, syncope, light-headedness,  numbness and headaches.  Hematological: Negative for adenopathy.  Psychiatric/Behavioral: Negative for agitation, confusion, sleep disturbance and suicidal ideas. The patient is not nervous/anxious.      Objective: BP 110/80   Ht 5' 4"  (1.626 m)   Wt 121 lb (54.9 kg)   LMP 08/16/2016   BMI 20.77 kg/m    Physical Exam Constitutional:      Appearance: She is well-developed.  Genitourinary:     Vulva, vagina, right adnexa and left adnexa normal.     No vaginal discharge, erythema or tenderness.     Cervix is absent.     No cervical bleeding.     Uterus is not tender.     Uterus is absent.     No right or left adnexal mass  present.     Right adnexa not tender.     Left adnexa not tender.     Genitourinary Comments: UTERUS/CX SURG REM  Neck:     Musculoskeletal: Normal range of motion.     Thyroid: No thyromegaly.  Cardiovascular:     Rate and Rhythm: Normal rate and regular rhythm.     Heart sounds: Normal heart sounds. No murmur.  Pulmonary:     Effort: Pulmonary effort is normal.     Breath sounds: Normal breath sounds.  Chest:     Breasts:        Right: No mass, nipple discharge, skin change or tenderness.        Left: No mass, nipple discharge, skin change or tenderness.  Abdominal:     Palpations: Abdomen is soft.     Tenderness: There is no abdominal tenderness. There is no guarding.  Musculoskeletal: Normal range of motion.  Neurological:     General: No focal deficit present.     Mental Status: She is alert and oriented to person, place, and time.     Cranial Nerves: No cranial nerve deficit.  Skin:    General: Skin is warm and dry.  Psychiatric:        Mood and Affect: Mood normal.        Behavior: Behavior normal.        Thought Content: Thought content normal.        Judgment: Judgment normal.  Vitals signs reviewed.     Assessment/Plan: Encounter for annual routine gynecological examination  Cervical cancer screening - Plan: Cytology - PAP   Screening for HPV (human papillomavirus) - Plan: Cytology - PAP; pt wants pap even though not indicated this yr  Encounter for screening mammogram for malignant neoplasm of breast - Plan: MM 3D SCREEN BREAST BILATERAL; pt to sched mammo  Postcoital bleeding - Plan: Cytology - PAP; Neg exam. No source of bleeding identified. No bleeding on exam. Question coming from post fourchette. Try lubrication. F/u prn.    GYN counsel mammography screening, adequate intake of calcium and vitamin D, diet and exercise     F/U  Return in about 1 year (around 09/07/2020).  Trezure Cronk B. Earnest Thalman, PA-C 09/08/2019 1:24 PM

## 2019-09-08 ENCOUNTER — Ambulatory Visit (INDEPENDENT_AMBULATORY_CARE_PROVIDER_SITE_OTHER): Payer: BC Managed Care – PPO | Admitting: Obstetrics and Gynecology

## 2019-09-08 ENCOUNTER — Other Ambulatory Visit (HOSPITAL_COMMUNITY)
Admission: RE | Admit: 2019-09-08 | Discharge: 2019-09-08 | Disposition: A | Discharge status: Home / Self Care | Payer: BC Managed Care – PPO | Source: Ambulatory Visit | Attending: Obstetrics and Gynecology | Admitting: Obstetrics and Gynecology

## 2019-09-08 ENCOUNTER — Other Ambulatory Visit: Payer: Self-pay

## 2019-09-08 ENCOUNTER — Encounter: Payer: Self-pay | Admitting: Obstetrics and Gynecology

## 2019-09-08 VITALS — BP 110/80 | Ht 64.0 in | Wt 121.0 lb

## 2019-09-08 DIAGNOSIS — N930 Postcoital and contact bleeding: Secondary | ICD-10-CM | POA: Diagnosis not present

## 2019-09-08 DIAGNOSIS — Z1151 Encounter for screening for human papillomavirus (HPV): Secondary | ICD-10-CM | POA: Insufficient documentation

## 2019-09-08 DIAGNOSIS — Z01419 Encounter for gynecological examination (general) (routine) without abnormal findings: Secondary | ICD-10-CM | POA: Diagnosis not present

## 2019-09-08 DIAGNOSIS — Z1231 Encounter for screening mammogram for malignant neoplasm of breast: Secondary | ICD-10-CM

## 2019-09-08 DIAGNOSIS — Z124 Encounter for screening for malignant neoplasm of cervix: Secondary | ICD-10-CM | POA: Diagnosis not present

## 2019-09-08 NOTE — Patient Instructions (Signed)
I value your feedback and entrusting us with your care. If you get a [facility] patient survey, I would appreciate you taking the time to let us know about your experience today. Thank you!  [facility]: [phone]    

## 2019-09-10 LAB — CYTOLOGY - PAP
Comment: NEGATIVE
Diagnosis: NEGATIVE
High risk HPV: NEGATIVE

## 2019-09-13 DIAGNOSIS — Z20828 Contact with and (suspected) exposure to other viral communicable diseases: Secondary | ICD-10-CM | POA: Diagnosis not present

## 2019-10-12 ENCOUNTER — Ambulatory Visit: Payer: BC Managed Care – PPO

## 2019-10-15 ENCOUNTER — Ambulatory Visit
Admission: RE | Admit: 2019-10-15 | Discharge: 2019-10-15 | Disposition: A | Discharge status: Home / Self Care | Payer: BC Managed Care – PPO | Source: Ambulatory Visit | Attending: Obstetrics and Gynecology | Admitting: Obstetrics and Gynecology

## 2019-10-15 ENCOUNTER — Encounter: Payer: Self-pay | Admitting: Obstetrics and Gynecology

## 2019-10-15 ENCOUNTER — Other Ambulatory Visit: Payer: Self-pay

## 2019-10-15 DIAGNOSIS — Z1231 Encounter for screening mammogram for malignant neoplasm of breast: Secondary | ICD-10-CM | POA: Insufficient documentation

## 2019-12-06 ENCOUNTER — Ambulatory Visit: Payer: 59

## 2019-12-15 ENCOUNTER — Other Ambulatory Visit: Payer: Self-pay | Admitting: Obstetrics and Gynecology

## 2019-12-15 ENCOUNTER — Encounter: Payer: Self-pay | Admitting: Obstetrics and Gynecology

## 2019-12-15 DIAGNOSIS — B3731 Acute candidiasis of vulva and vagina: Secondary | ICD-10-CM

## 2019-12-15 DIAGNOSIS — B373 Candidiasis of vulva and vagina: Secondary | ICD-10-CM

## 2019-12-15 MED ORDER — FLUCONAZOLE 150 MG PO TABS: 150.0000 mg | ORAL_TABLET | Freq: Once | ORAL | 0 refills | Status: DC

## 2019-12-15 NOTE — Progress Notes (Signed)
Rx RF diflucan for yeast vag sx.

## 2020-01-31 DIAGNOSIS — H6123 Impacted cerumen, bilateral: Secondary | ICD-10-CM | POA: Diagnosis not present

## 2020-06-07 ENCOUNTER — Encounter: Payer: Self-pay | Admitting: Obstetrics and Gynecology

## 2020-06-07 ENCOUNTER — Other Ambulatory Visit: Payer: Self-pay | Admitting: Obstetrics and Gynecology

## 2020-06-07 DIAGNOSIS — B3731 Acute candidiasis of vulva and vagina: Secondary | ICD-10-CM

## 2020-06-07 MED ORDER — FLUCONAZOLE 150 MG PO TABS: 150.0000 mg | ORAL_TABLET | Freq: Once | ORAL | 0 refills | Status: DC

## 2020-06-07 NOTE — Telephone Encounter (Signed)
Patient is calling to ask if [facility] will send in Diflucan to her pharmacy. Patient states yeast infections are common, that she doesn't have a history and doesn't feel she needs an office visit. Please advise

## 2020-06-07 NOTE — Progress Notes (Signed)
Rx diflucan for yeast vag sx.

## 2020-06-12 ENCOUNTER — Other Ambulatory Visit: Payer: Self-pay | Admitting: Obstetrics and Gynecology

## 2020-06-12 DIAGNOSIS — B3731 Acute candidiasis of vulva and vagina: Secondary | ICD-10-CM

## 2020-06-12 MED ORDER — FLUCONAZOLE 150 MG PO TABS: 150.0000 mg | ORAL_TABLET | Freq: Once | ORAL | 0 refills | Status: DC

## 2020-06-12 NOTE — Progress Notes (Signed)
Rx RF diflucan. Not fully gone after 1 dose

## 2020-08-10 DIAGNOSIS — M26609 Unspecified temporomandibular joint disorder, unspecified side: Secondary | ICD-10-CM | POA: Diagnosis not present

## 2020-08-10 DIAGNOSIS — H6123 Impacted cerumen, bilateral: Secondary | ICD-10-CM | POA: Diagnosis not present

## 2020-08-10 DIAGNOSIS — H6983 Other specified disorders of Eustachian tube, bilateral: Secondary | ICD-10-CM | POA: Diagnosis not present

## 2020-08-22 ENCOUNTER — Other Ambulatory Visit: Payer: Self-pay | Admitting: Obstetrics and Gynecology

## 2020-08-22 ENCOUNTER — Encounter: Payer: Self-pay | Admitting: Obstetrics and Gynecology

## 2020-08-22 DIAGNOSIS — B373 Candidiasis of vulva and vagina: Secondary | ICD-10-CM

## 2020-08-22 DIAGNOSIS — B3731 Acute candidiasis of vulva and vagina: Secondary | ICD-10-CM

## 2020-08-22 MED ORDER — FLUCONAZOLE 150 MG PO TABS: 150.0000 mg | ORAL_TABLET | Freq: Once | ORAL | 0 refills | Status: DC

## 2020-08-22 NOTE — Progress Notes (Signed)
Rx RF diflucan for yeast vag sx

## 2020-08-31 ENCOUNTER — Other Ambulatory Visit: Payer: Self-pay | Admitting: Obstetrics and Gynecology

## 2020-08-31 DIAGNOSIS — Z1231 Encounter for screening mammogram for malignant neoplasm of breast: Secondary | ICD-10-CM

## 2020-09-14 NOTE — Progress Notes (Signed)
PCP:  Patient, No Pcp Per   Chief Complaint  Patient presents with  . Gynecologic Exam     HPI:      Betty Rodgers is a 44 y.o. G0P0000 who LMP was Patient's last menstrual period was 08/16/2016., presents today for her annual examination.  Her menses are absent due to lap hyst/salpingectomy for menorrhagia and leio 2017 with Dr. Kenton Kingfisher.  She does not have intermenstrual bleeding. No vasomotor sx.  Sex activity: single partner, contraception - status post hysterectomy. Has occas vaginal dryness with sex, doesn't use lubricants. Has noted scant amt pink d/c right after sex due to skin tear. No bleeding if not sex active. Last Pap: 09/08/19  Results were: no abnormalities /neg HPV DNA. No longer recommended with hyst. Hx of STDs: none  Has had several recurret yeast vag this past yr. Pt using dove sens skin soap, no dryer sheets. Wears thongs regularly  Last mammogram: 10/15/19  Results were: normal--routine follow-up in 12 months. Has appt 12/21 There is no FH of breast cancer. There is no FH of ovarian cancer. Her biological mom died last yr from cancer (unknown primary site, question pancreatic) in her 76s. The patient does self-breast exams.  Tobacco use: The patient denies current or previous tobacco use. Alcohol use: none No drug use.  Exercise: mod active  Colonoscopy: 2017 with polyps; repeat due after 5 yrs. Dad with possible colon cancer dx, being eval currently.  She does get adequate calcium and Vitamin D in her diet.  Labs with PCP in past but he left. Due for labs today.   Past Medical History:  Diagnosis Date  . Acute abdominal pain in right lower quadrant 03/25/2016  . Anxiety disorder 09/27/2015  . Anxiety state, unspecified   . Chronic lymphocytic thyroiditis   . History of Crohn's disease   . Irritable bowel syndrome   . Other chest pain   . Other specified congenital anomaly of skin   . Sciatica   . Unspecified chronic bronchitis ([place])   .  Unspecified sinusitis (chronic)   . Vaginitis and vulvovaginitis, unspecified     Past Surgical History:  Procedure Laterality Date  . ABDOMINAL HYSTERECTOMY    . COLONOSCOPY WITH PROPOFOL N/A 05/09/2016   Procedure: COLONOSCOPY WITH PROPOFOL;  Surgeon: Lucilla Lame, MD;  Location: [facility];  Service: Endoscopy;  Laterality: N/A;  PLEASE LEAVE EARLY AS POSSIBLE  . CYSTOSCOPY N/A 08/20/2016   Procedure: CYSTOSCOPY;  Surgeon: Gae Dry, MD;  Location: [facility];  Service: Gynecology;  Laterality: N/A;  . LAPAROSCOPIC BILATERAL SALPINGECTOMY Bilateral 08/20/2016   Procedure: LAPAROSCOPIC BILATERAL SALPINGECTOMY;  Surgeon: Gae Dry, MD;  Location: [facility];  Service: Gynecology;  Laterality: Bilateral;  . LAPAROSCOPIC HYSTERECTOMY N/A 08/20/2016   Procedure: HYSTERECTOMY TOTAL LAPAROSCOPIC;  Surgeon: Gae Dry, MD;  Location: [facility];  Service: Gynecology;  Laterality: N/A;  . POLYPECTOMY  05/09/2016   Procedure: POLYPECTOMY;  Surgeon: Lucilla Lame, MD;  Location: [facility];  Service: Endoscopy;;  . WISDOM TOOTH EXTRACTION      Family History  Problem Relation Age of Onset  . Hypertension Father   . Heart disease Father   . Diabetes Father        Controlled  . Heart attack Paternal Grandmother   . Cancer Mother 21       ? source  . Breast cancer Neg Hx     Social History   Socioeconomic History  . Marital status: Single  Spouse name: Not on file  . Number of children: Not on file  . Years of education: Not on file  . Highest education level: Not on file  Occupational History  . Not on file  Tobacco Use  . Smoking status: Never Smoker  . Smokeless tobacco: Never Used  Vaping Use  . Vaping Use: Never used  Substance and Sexual Activity  . Alcohol use: Yes    Alcohol/week: 7.0 standard drinks    Types: 7 Shots of liquor per week  . Drug use: No  . Sexual activity: Yes    Partners: Male    Birth control/protection: Surgical     Comment: Hysterectomy  Other Topics Concern  . Not on file  Social History Narrative  . Not on file   Social Determinants of Health   Financial Resource Strain:   . Difficulty of Paying Living Expenses: Not on file  Food Insecurity:   . Worried About Charity fundraiser in the Last Year: Not on file  . Ran Out of Food in the Last Year: Not on file  Transportation Needs:   . Lack of Transportation (Medical): Not on file  . Lack of Transportation (Non-Medical): Not on file  Physical Activity:   . Days of Exercise per Week: Not on file  . Minutes of Exercise per Session: Not on file  Stress:   . Feeling of Stress : Not on file  Social Connections:   . Frequency of Communication with Friends and Family: Not on file  . Frequency of Social Gatherings with Friends and Family: Not on file  . Attends Religious Services: Not on file  . Active Member of Clubs or Organizations: Not on file  . Attends Archivist Meetings: Not on file  . Marital Status: Not on file  Intimate Partner Violence:   . Fear of Current or Ex-Partner: Not on file  . Emotionally Abused: Not on file  . Physically Abused: Not on file  . Sexually Abused: Not on file    Current Meds  Medication Sig  . ALPRAZolam (XANAX) 1 MG tablet Take 1 tablet (1 mg total) by mouth at bedtime as needed for anxiety.  . Ascorbic Acid (VITAMIN C PO) Take 1 tablet by mouth daily.   . B Complex Vitamins (VITAMIN B-COMPLEX PO) Take 1 tablet by mouth daily.   . Cholecalciferol (VITAMIN D PO) Take 1 tablet by mouth daily.   . Multiple Vitamin (MULTIVITAMIN) tablet Take 1 tablet by mouth daily.  . Omega-3 Fatty Acids (OMEGA 3 PO) Take 1 capsule by mouth daily.      ROS:  Review of Systems  Constitutional: Negative for fatigue, fever and unexpected weight change.  Respiratory: Negative for cough, shortness of breath and wheezing.   Cardiovascular: Negative for chest pain, palpitations and leg swelling.  Gastrointestinal:  Negative for blood in stool, constipation, diarrhea, nausea and vomiting.  Endocrine: Negative for cold intolerance, heat intolerance and polyuria.  Genitourinary: Negative for dyspareunia, dysuria, flank pain, frequency, genital sores, hematuria, menstrual problem, pelvic pain, urgency, vaginal bleeding, vaginal discharge and vaginal pain.  Musculoskeletal: Negative for back pain, joint swelling and myalgias.  Skin: Negative for rash.  Neurological: Negative for dizziness, syncope, light-headedness, numbness and headaches.  Hematological: Negative for adenopathy.  Psychiatric/Behavioral: Negative for agitation, confusion, sleep disturbance and suicidal ideas. The patient is not nervous/anxious.      Objective: BP 90/60   Ht 5' 4"  (1.626 m)   Wt 125 lb (56.7 kg)  LMP 08/16/2016   BMI 21.46 kg/m    Physical Exam Constitutional:      Appearance: She is well-developed.  Genitourinary:     Vulva, vagina, right adnexa and left adnexa normal.     No vaginal discharge, erythema or tenderness.     Cervix is absent.     Uterus is absent.     No right or left adnexal mass present.     Right adnexa not tender.     Left adnexa not tender.     Genitourinary Comments: UTERUS/CX SURG REM  Neck:     Thyroid: No thyromegaly.  Cardiovascular:     Rate and Rhythm: Normal rate and regular rhythm.     Heart sounds: Normal heart sounds. No murmur heard.   Pulmonary:     Effort: Pulmonary effort is normal.     Breath sounds: Normal breath sounds.  Chest:     Breasts:        Right: No mass, nipple discharge, skin change or tenderness.        Left: No mass, nipple discharge, skin change or tenderness.  Abdominal:     Palpations: Abdomen is soft.     Tenderness: There is no abdominal tenderness. There is no guarding.  Musculoskeletal:        General: Normal range of motion.     Cervical back: Normal range of motion.  Neurological:     General: No focal deficit present.     Mental Status:  She is alert and oriented to person, place, and time.     Cranial Nerves: No cranial nerve deficit.  Skin:    General: Skin is warm and dry.  Psychiatric:        Mood and Affect: Mood normal.        Behavior: Behavior normal.        Thought Content: Thought content normal.        Judgment: Judgment normal.  Vitals reviewed.     Assessment/Plan: Encounter for annual routine gynecological examination  Encounter for screening mammogram for malignant neoplasm of breast; pt has appt.   Candidal vaginitis--several per yr; d/c thongs. Will RF diflucan prn.  Blood tests for routine general physical examination - Plan: Comprehensive metabolic panel, Lipid panel, TSH + free T4  Screening cholesterol level - Plan: Lipid panel  Thyroid disorder screening - Plan: TSH + free T4    GYN counsel mammography screening, adequate intake of calcium and vitamin D, diet and exercise     F/U  Return in about 1 year (around 09/18/2021).  Karista Aispuro B. Alyscia Carmon, PA-C 09/18/2020 9:02 AM

## 2020-09-18 ENCOUNTER — Encounter: Payer: Self-pay | Admitting: Obstetrics and Gynecology

## 2020-09-18 ENCOUNTER — Ambulatory Visit (INDEPENDENT_AMBULATORY_CARE_PROVIDER_SITE_OTHER): Payer: BC Managed Care – PPO | Admitting: Obstetrics and Gynecology

## 2020-09-18 ENCOUNTER — Other Ambulatory Visit: Payer: Self-pay

## 2020-09-18 VITALS — BP 90/60 | Ht 64.0 in | Wt 125.0 lb

## 2020-09-18 DIAGNOSIS — Z1329 Encounter for screening for other suspected endocrine disorder: Secondary | ICD-10-CM

## 2020-09-18 DIAGNOSIS — B3731 Acute candidiasis of vulva and vagina: Secondary | ICD-10-CM

## 2020-09-18 DIAGNOSIS — Z13220 Encounter for screening for lipoid disorders: Secondary | ICD-10-CM | POA: Diagnosis not present

## 2020-09-18 DIAGNOSIS — Z0000 Encounter for general adult medical examination without abnormal findings: Secondary | ICD-10-CM | POA: Diagnosis not present

## 2020-09-18 DIAGNOSIS — B373 Candidiasis of vulva and vagina: Secondary | ICD-10-CM | POA: Diagnosis not present

## 2020-09-18 DIAGNOSIS — Z1231 Encounter for screening mammogram for malignant neoplasm of breast: Secondary | ICD-10-CM

## 2020-09-18 DIAGNOSIS — Z01419 Encounter for gynecological examination (general) (routine) without abnormal findings: Secondary | ICD-10-CM | POA: Diagnosis not present

## 2020-09-18 DIAGNOSIS — Z1211 Encounter for screening for malignant neoplasm of colon: Secondary | ICD-10-CM

## 2020-09-18 NOTE — Patient Instructions (Signed)
I value your feedback and entrusting us with your care. If you get a [facility] patient survey, I would appreciate you taking the time to let us know about your experience today. Thank you!  As of October 14, 2019, your lab results will be released to your MyChart immediately, before I even have a chance to see them. Please give me time to review them and contact you if there are any abnormalities. Thank you for your patience.  

## 2020-09-19 LAB — COMPREHENSIVE METABOLIC PANEL
ALT: 9 IU/L (ref 0–32)
AST: 14 IU/L (ref 0–40)
Albumin/Globulin Ratio: 2.1 (ref 1.2–2.2)
Albumin: 4.4 g/dL (ref 3.8–4.8)
Alkaline Phosphatase: 56 IU/L (ref 44–121)
BUN/Creatinine Ratio: 20 (ref 9–23)
BUN: 16 mg/dL (ref 6–24)
Bilirubin Total: 0.5 mg/dL (ref 0.0–1.2)
CO2: 23 mmol/L (ref 20–29)
Calcium: 9.2 mg/dL (ref 8.7–10.2)
Chloride: 103 mmol/L (ref 96–106)
Creatinine, Ser: 0.81 mg/dL (ref 0.57–1.00)
GFR calc Af Amer: 101 mL/min/{1.73_m2} (ref >59)
GFR calc non Af Amer: 87 mL/min/{1.73_m2} (ref >59)
Globulin, Total: 2.1 g/dL (ref 1.5–4.5)
Glucose: 82 mg/dL (ref 65–99)
Potassium: 4.2 mmol/L (ref 3.5–5.2)
Sodium: 139 mmol/L (ref 134–144)
Total Protein: 6.5 g/dL (ref 6.0–8.5)

## 2020-09-19 LAB — LIPID PANEL
Chol/HDL Ratio: 2.5 ratio (ref 0.0–4.4)
Cholesterol, Total: 184 mg/dL (ref 100–199)
HDL: 75 mg/dL (ref >39)
LDL Chol Calc (NIH): 100 mg/dL — ABNORMAL HIGH (ref 0–99)
Triglycerides: 43 mg/dL (ref 0–149)
VLDL Cholesterol Cal: 9 mg/dL (ref 5–40)

## 2020-09-19 LAB — TSH+FREE T4
Free T4: 1.07 ng/dL (ref 0.82–1.77)
TSH: 3.62 u[IU]/mL (ref 0.450–4.500)

## 2020-10-05 DIAGNOSIS — L578 Other skin changes due to chronic exposure to nonionizing radiation: Secondary | ICD-10-CM | POA: Diagnosis not present

## 2020-10-10 ENCOUNTER — Other Ambulatory Visit: Payer: Self-pay

## 2020-10-17 ENCOUNTER — Ambulatory Visit
Admission: RE | Admit: 2020-10-17 | Discharge: 2020-10-17 | Disposition: A | Discharge status: Home / Self Care | Payer: BC Managed Care – PPO | Source: Ambulatory Visit | Attending: Obstetrics and Gynecology | Admitting: Obstetrics and Gynecology

## 2020-10-17 ENCOUNTER — Other Ambulatory Visit: Payer: Self-pay

## 2020-10-17 DIAGNOSIS — Z1231 Encounter for screening mammogram for malignant neoplasm of breast: Secondary | ICD-10-CM | POA: Insufficient documentation

## 2020-10-19 ENCOUNTER — Other Ambulatory Visit: Payer: Self-pay | Admitting: Obstetrics and Gynecology

## 2020-10-19 DIAGNOSIS — N6489 Other specified disorders of breast: Secondary | ICD-10-CM

## 2020-10-19 DIAGNOSIS — R928 Other abnormal and inconclusive findings on diagnostic imaging of breast: Secondary | ICD-10-CM

## 2020-10-25 ENCOUNTER — Ambulatory Visit
Admission: RE | Admit: 2020-10-25 | Discharge: 2020-10-25 | Disposition: A | Discharge status: Home / Self Care | Payer: BC Managed Care – PPO | Source: Ambulatory Visit | Attending: Obstetrics and Gynecology | Admitting: Obstetrics and Gynecology

## 2020-10-25 ENCOUNTER — Other Ambulatory Visit: Payer: Self-pay

## 2020-10-25 ENCOUNTER — Other Ambulatory Visit: Payer: Self-pay | Admitting: Obstetrics and Gynecology

## 2020-10-25 DIAGNOSIS — R928 Other abnormal and inconclusive findings on diagnostic imaging of breast: Secondary | ICD-10-CM | POA: Diagnosis not present

## 2020-10-25 DIAGNOSIS — N6489 Other specified disorders of breast: Secondary | ICD-10-CM | POA: Diagnosis not present

## 2020-10-25 DIAGNOSIS — Z1231 Encounter for screening mammogram for malignant neoplasm of breast: Secondary | ICD-10-CM

## 2020-10-25 NOTE — Progress Notes (Signed)
Dx mammo and u/s RT breast orders for 6 mo f/u

## 2020-11-16 ENCOUNTER — Encounter: Payer: Self-pay | Admitting: Obstetrics and Gynecology

## 2020-11-16 ENCOUNTER — Other Ambulatory Visit: Payer: Self-pay | Admitting: Obstetrics and Gynecology

## 2020-11-16 DIAGNOSIS — B373 Candidiasis of vulva and vagina: Secondary | ICD-10-CM

## 2020-11-16 DIAGNOSIS — B3731 Acute candidiasis of vulva and vagina: Secondary | ICD-10-CM

## 2020-11-16 MED ORDER — FLUCONAZOLE 150 MG PO TABS: 150.0000 mg | ORAL_TABLET | Freq: Once | ORAL | 1 refills | Status: AC

## 2020-11-16 NOTE — Progress Notes (Signed)
Rx RF diflucan for yeast vag sx

## 2021-04-17 ENCOUNTER — Ambulatory Visit
Admission: RE | Admit: 2021-04-17 | Discharge: 2021-04-17 | Disposition: A | Discharge status: Home / Self Care | Payer: BC Managed Care – PPO | Source: Ambulatory Visit | Attending: Obstetrics and Gynecology | Admitting: Obstetrics and Gynecology

## 2021-04-17 ENCOUNTER — Encounter: Payer: Self-pay | Admitting: Obstetrics and Gynecology

## 2021-04-17 ENCOUNTER — Other Ambulatory Visit: Payer: Self-pay

## 2021-04-17 DIAGNOSIS — R928 Other abnormal and inconclusive findings on diagnostic imaging of breast: Secondary | ICD-10-CM

## 2021-04-17 DIAGNOSIS — Z1231 Encounter for screening mammogram for malignant neoplasm of breast: Secondary | ICD-10-CM

## 2021-04-17 DIAGNOSIS — R922 Inconclusive mammogram: Secondary | ICD-10-CM | POA: Diagnosis not present

## 2021-04-17 DIAGNOSIS — N6001 Solitary cyst of right breast: Secondary | ICD-10-CM | POA: Diagnosis not present

## 2021-07-18 ENCOUNTER — Other Ambulatory Visit: Payer: Self-pay | Admitting: Obstetrics and Gynecology

## 2021-07-18 ENCOUNTER — Encounter: Payer: Self-pay | Admitting: Obstetrics and Gynecology

## 2021-07-18 DIAGNOSIS — N3000 Acute cystitis without hematuria: Secondary | ICD-10-CM

## 2021-07-18 MED ORDER — FLUCONAZOLE 150 MG PO TABS
150.0000 mg | ORAL_TABLET | Freq: Once | ORAL | 0 refills | Status: DC
Start: 2021-07-18 — End: 2021-09-03

## 2021-07-18 MED ORDER — NITROFURANTOIN MONOHYD MACRO 100 MG PO CAPS: 100.0000 mg | ORAL_CAPSULE | Freq: Two times a day (BID) | ORAL | 0 refills | Status: AC

## 2021-07-18 NOTE — Progress Notes (Signed)
Rx macrobid for UTI sx; pt out of town.

## 2021-07-23 ENCOUNTER — Telehealth: Payer: Self-pay

## 2021-07-23 NOTE — Telephone Encounter (Signed)
Called patient she only wants to talk to ginger

## 2021-07-23 NOTE — Telephone Encounter (Signed)
Called patient she wants to only talk to ginger so fowarding call to ginger

## 2021-07-23 NOTE — Telephone Encounter (Signed)
Pt. Ready to schedule colonoscopy 

## 2021-07-24 ENCOUNTER — Other Ambulatory Visit: Payer: Self-pay

## 2021-07-24 DIAGNOSIS — Z1211 Encounter for screening for malignant neoplasm of colon: Secondary | ICD-10-CM

## 2021-07-24 DIAGNOSIS — G5600 Carpal tunnel syndrome, unspecified upper limb: Secondary | ICD-10-CM | POA: Insufficient documentation

## 2021-07-24 MED ORDER — NA SULFATE-K SULFATE-MG SULF 17.5-3.13-1.6 GM/177ML PO SOLN: 1.0000 | ORAL | 0 refills | Status: DC

## 2021-07-24 NOTE — Telephone Encounter (Signed)
Pt has been scheduled for a colonoscopy in [place] on 09/14/21.

## 2021-07-31 IMAGING — MG DIGITAL SCREENING BILAT W/ TOMO W/ CAD
8 series · 9 of 24 positions shown · non-contrast
Comparison: Previous exams.

CLINICAL DATA: Screening.

EXAM:
DIGITAL SCREENING BILATERAL MAMMOGRAM WITH TOMO AND CAD

[L MLO synth-2D]
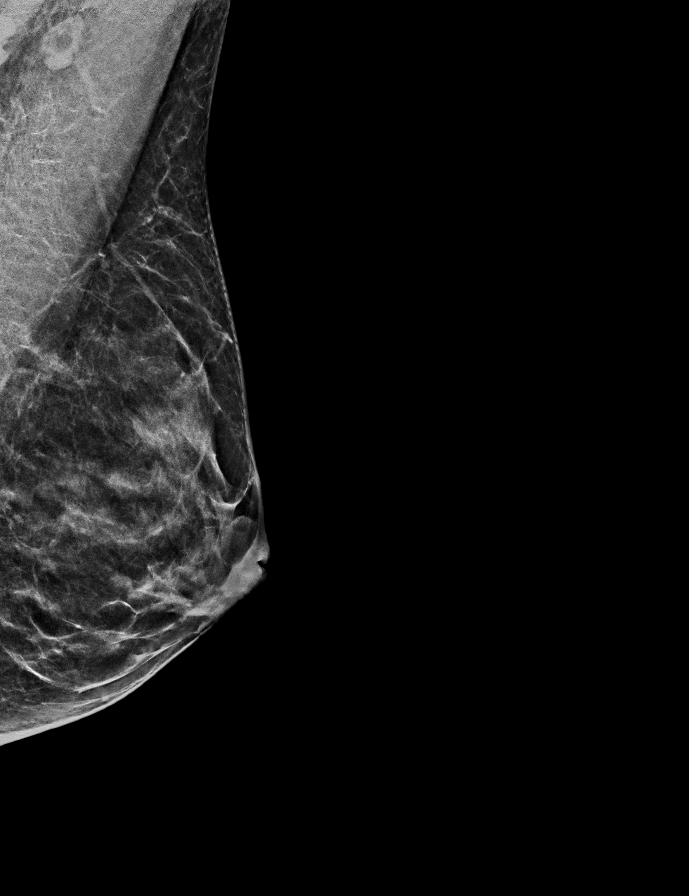

[R MLO synth-2D]
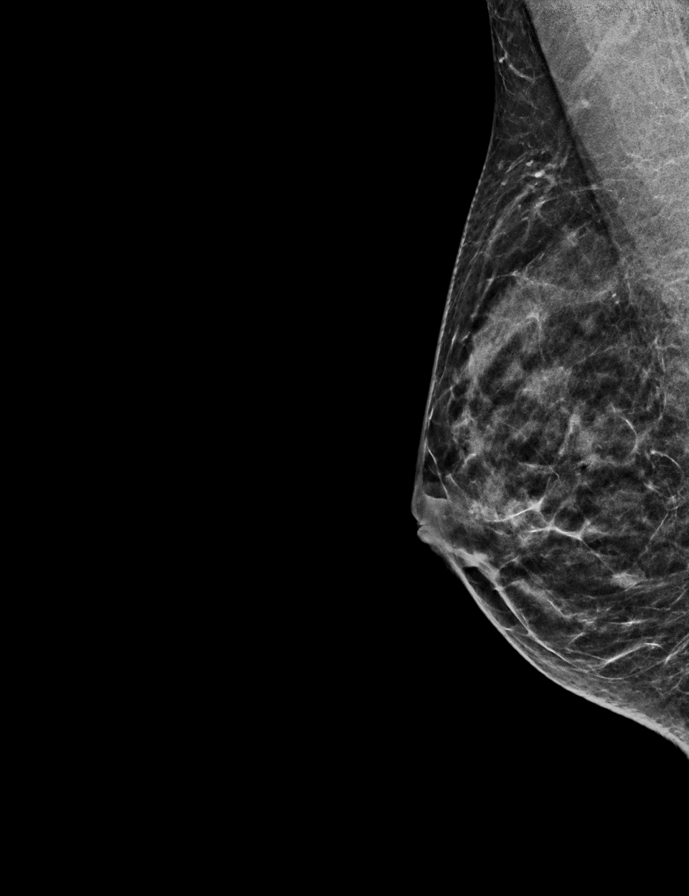

[R CC synth-2D]
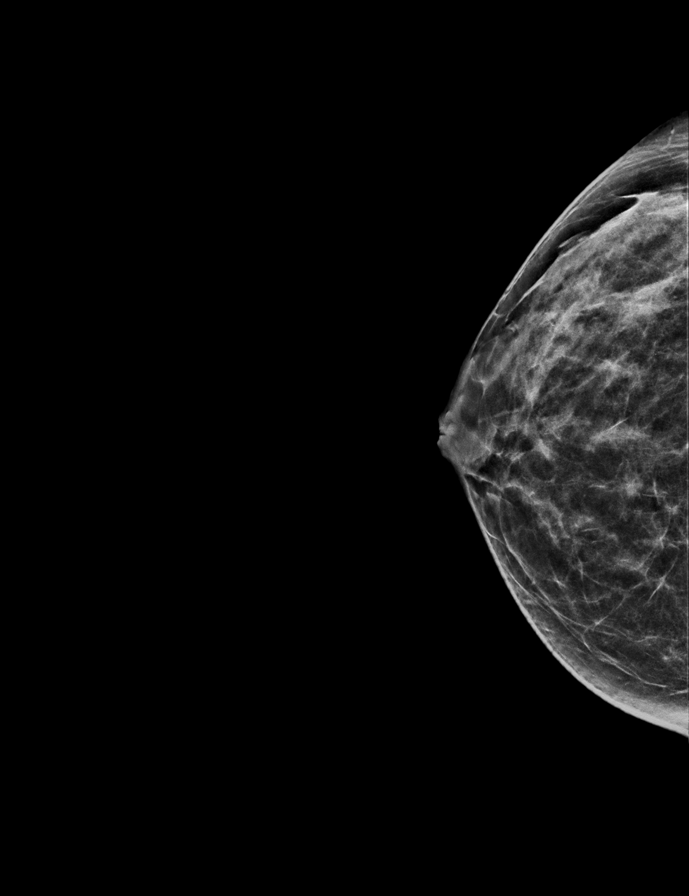

[L CC synth-2D]
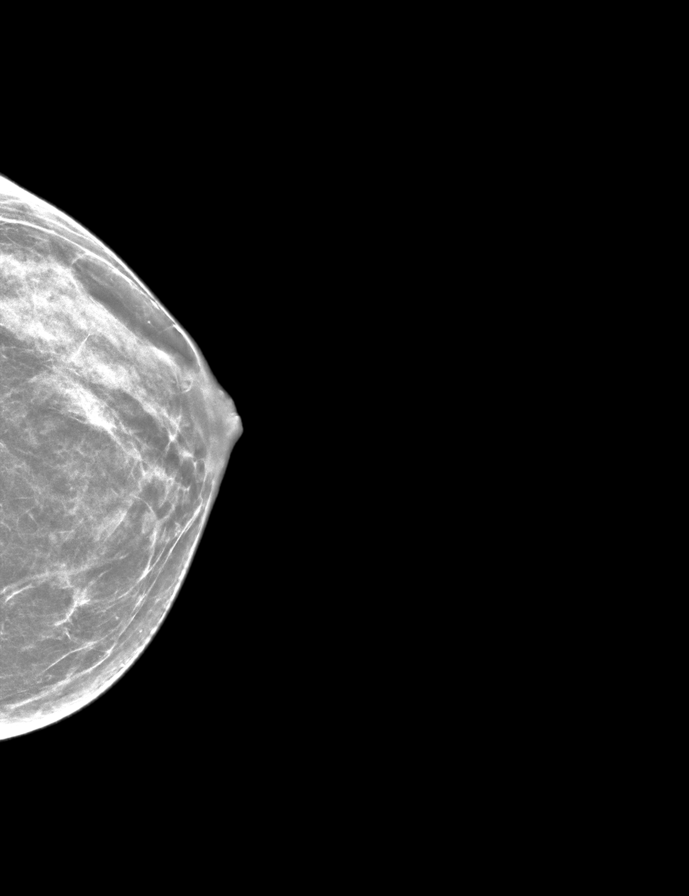

[R CC tomo · 2 of 49 frames shown]
[frame 16/49]
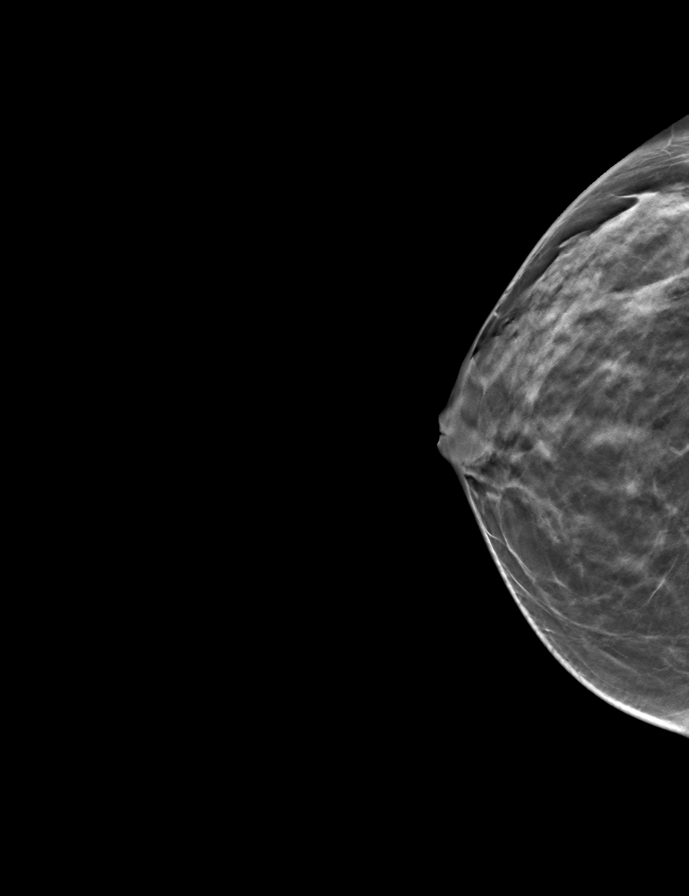
[frame 25/49]
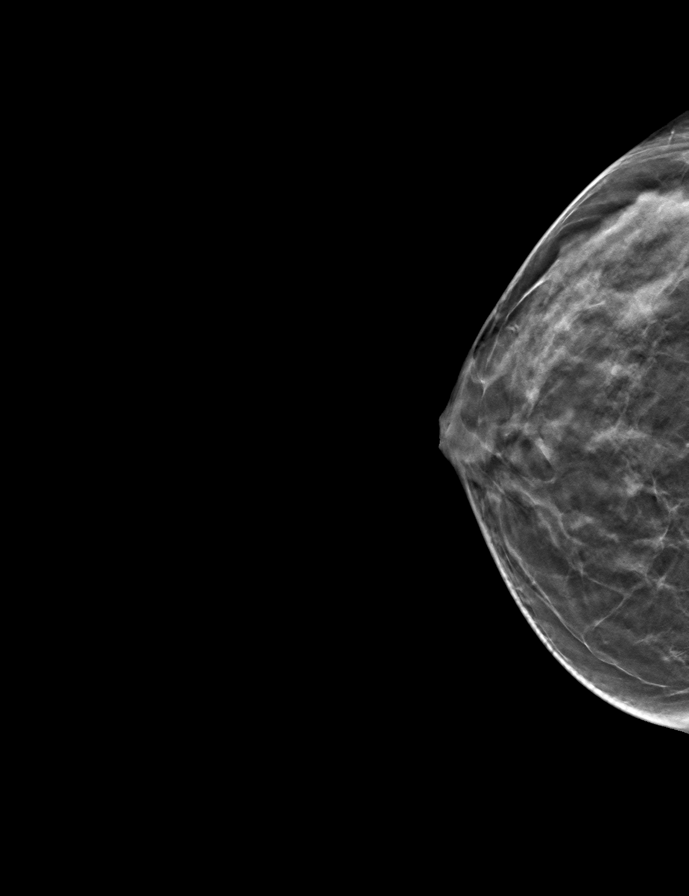

[L MLO tomo · tomo slice 25/49.0]
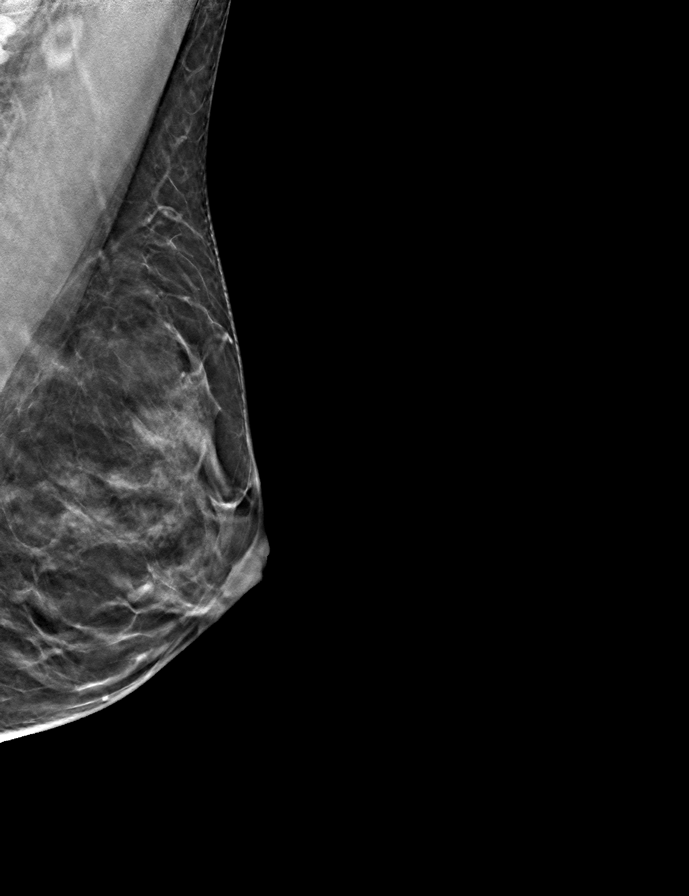

[R MLO tomo · tomo slice 23/46.0]
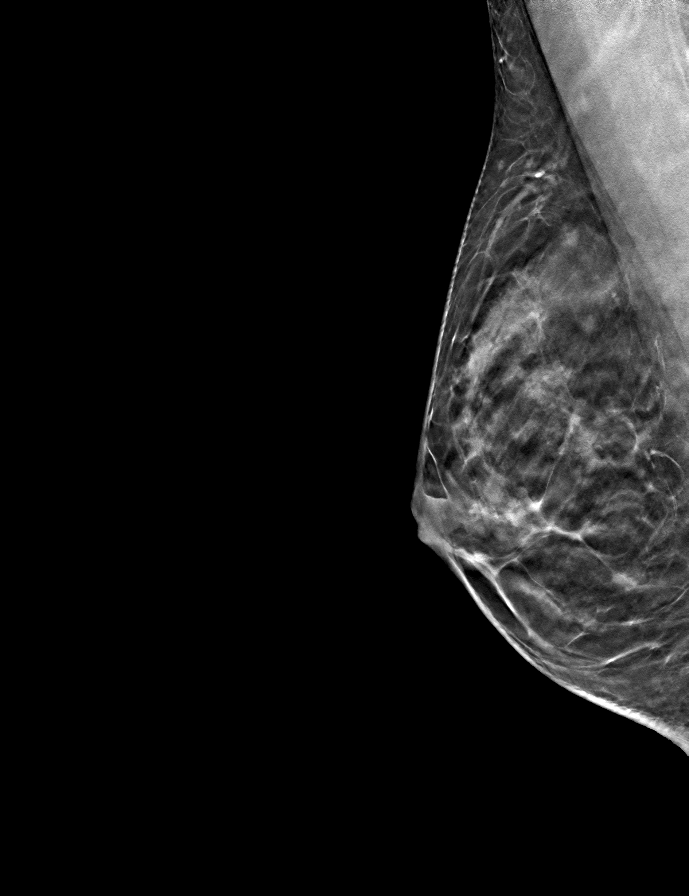

[L CC tomo · tomo slice 26/51.0]
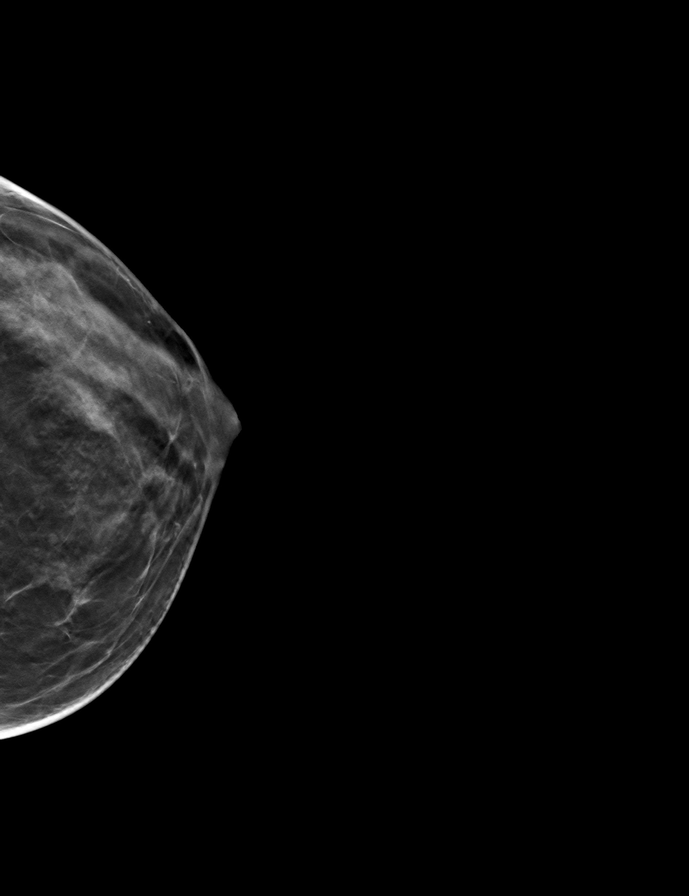

[9 of 24 positions shown; findings below may reference images not displayed]

ACR Breast Density Category c: The breast tissue is heterogeneously
dense, which may obscure small masses.
FINDINGS: In the right breast, a possible asymmetry warrants further
evaluation. In the left breast, no findings suspicious for
malignancy. Images were processed with CAD.
IMPRESSION: Further evaluation is suggested for possible asymmetry in the right
breast.

RECOMMENDATION:
Diagnostic mammogram and possibly ultrasound of the right breast.
(Code:1E-1-SSP)

The patient will be contacted regarding the findings, and additional
imaging will be scheduled.

BI-RADS CATEGORY  0: Incomplete. Need additional imaging evaluation
and/or prior mammograms for comparison.

## 2021-08-08 IMAGING — US US BREAST*R* LIMITED INC AXILLA
1 series · 6 of 6 positions shown · non-contrast
Comparison: Previous exam(s).

CLINICAL DATA: 44-year-old female presenting as a recall from
screening for possible right breast mass.

EXAM:
DIGITAL DIAGNOSTIC RIGHT MAMMOGRAM WITH TOMO
ULTRASOUND RIGHT BREAST

[Series 1: us breast*right* limited inc axilla · 0.03mm/px · 6 of 6 slices shown]
[im 1/6]
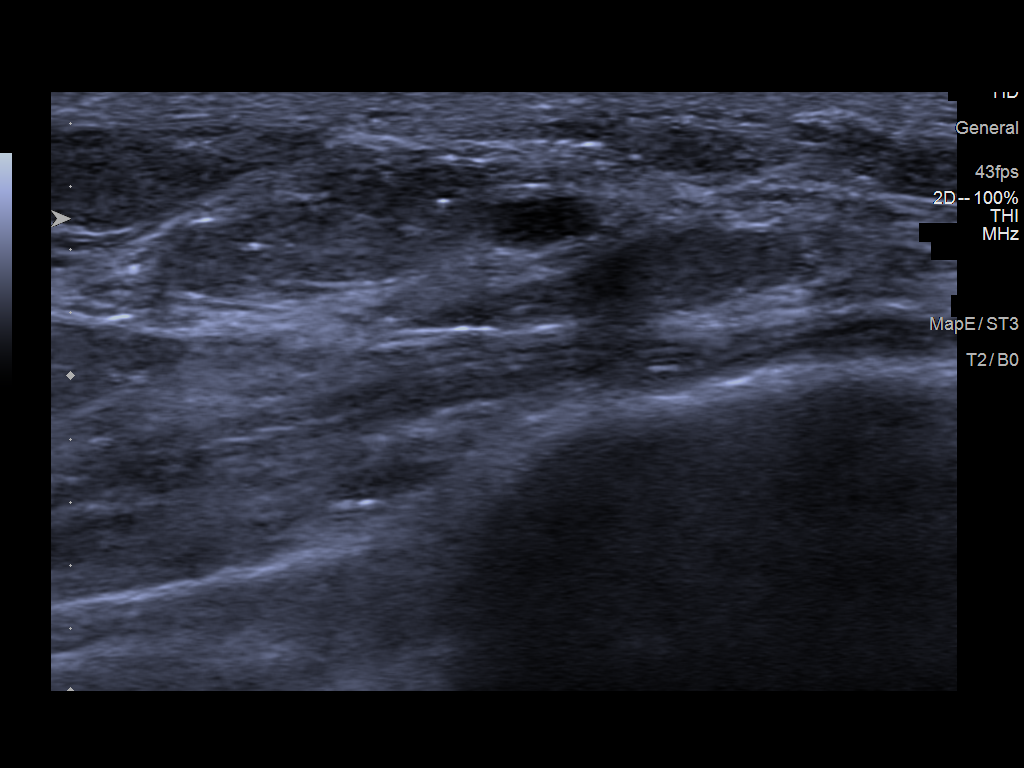
[im 2/6]
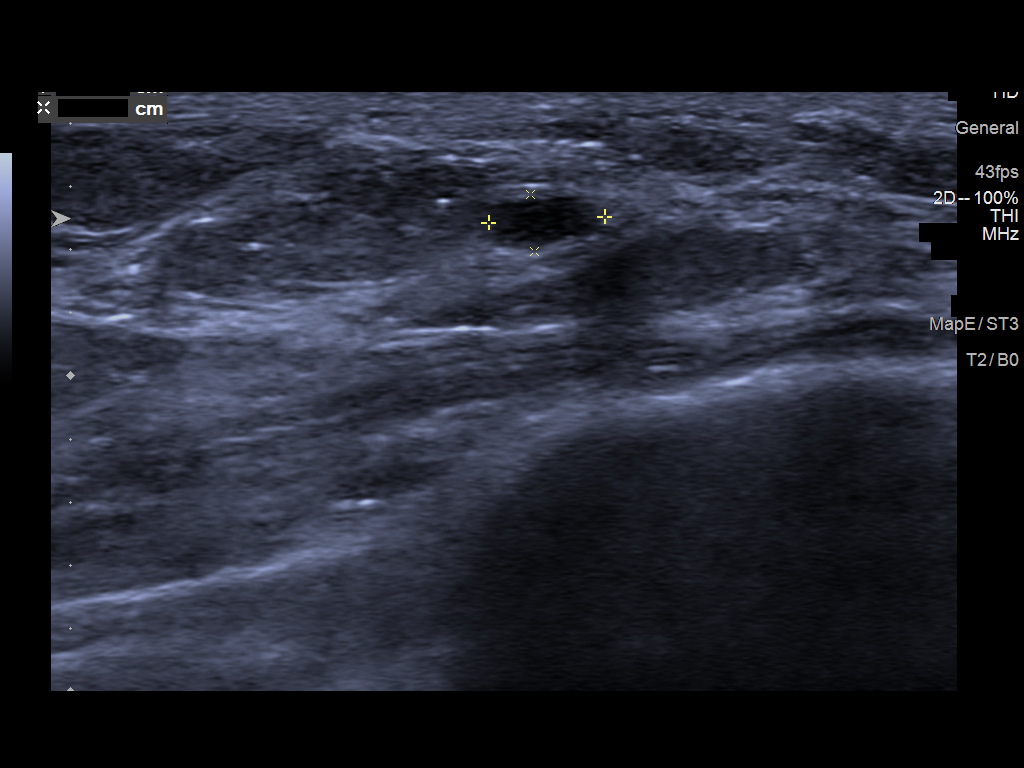
[im 3/6]
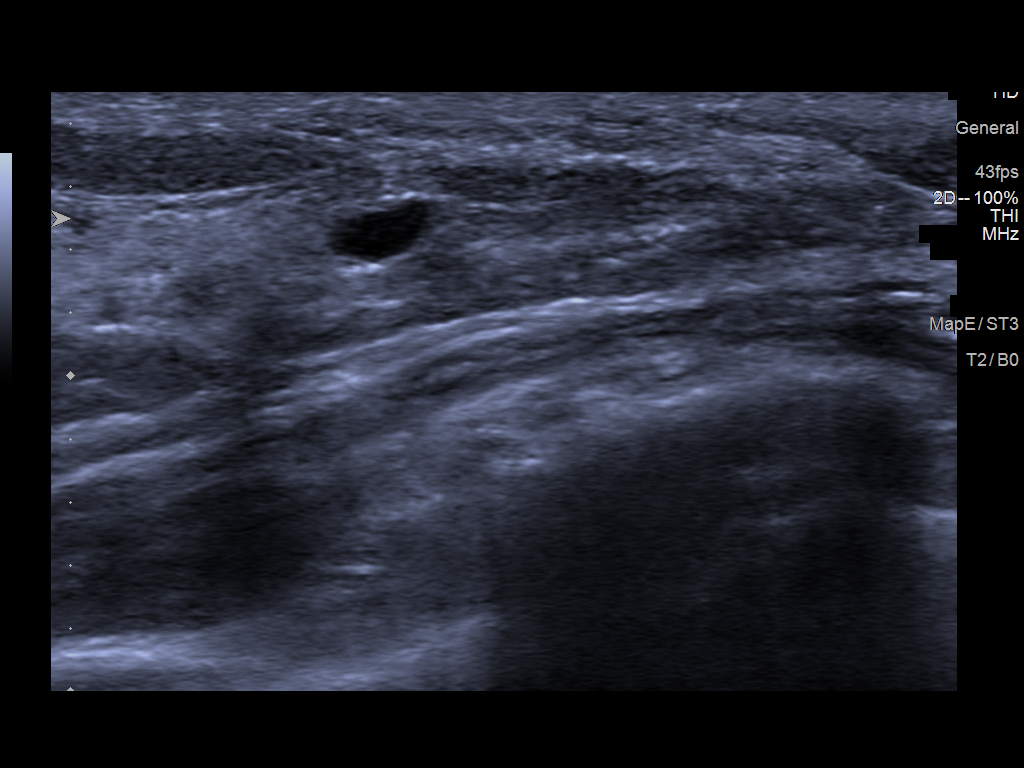
[im 4/6]
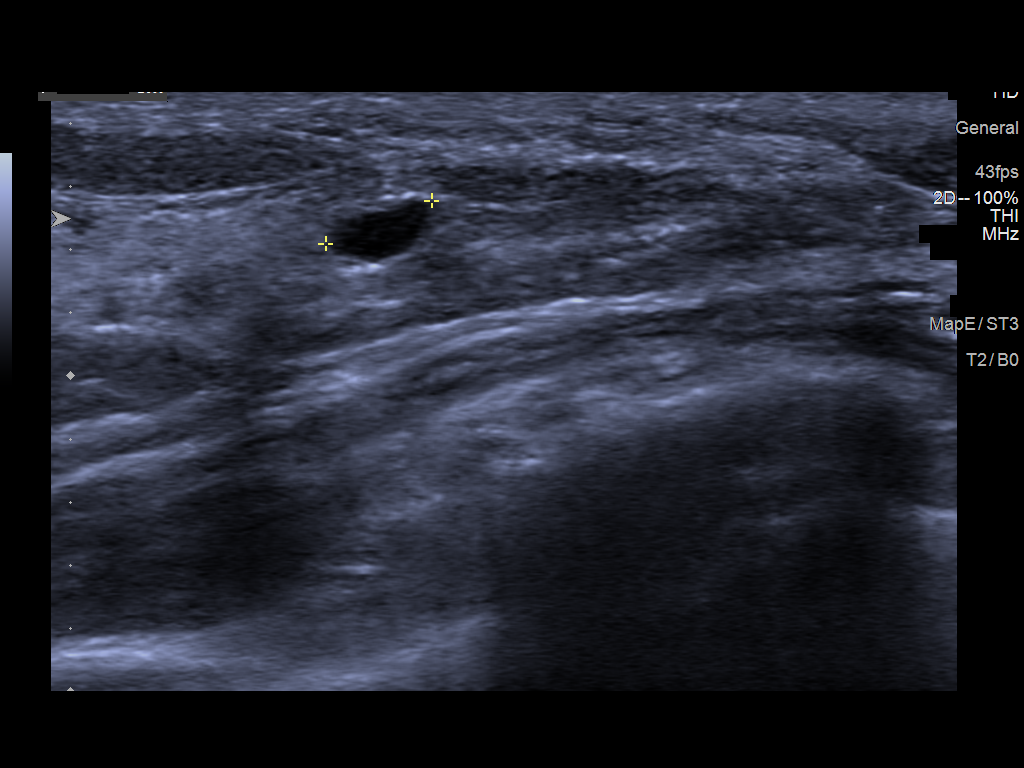
[im 5/6]
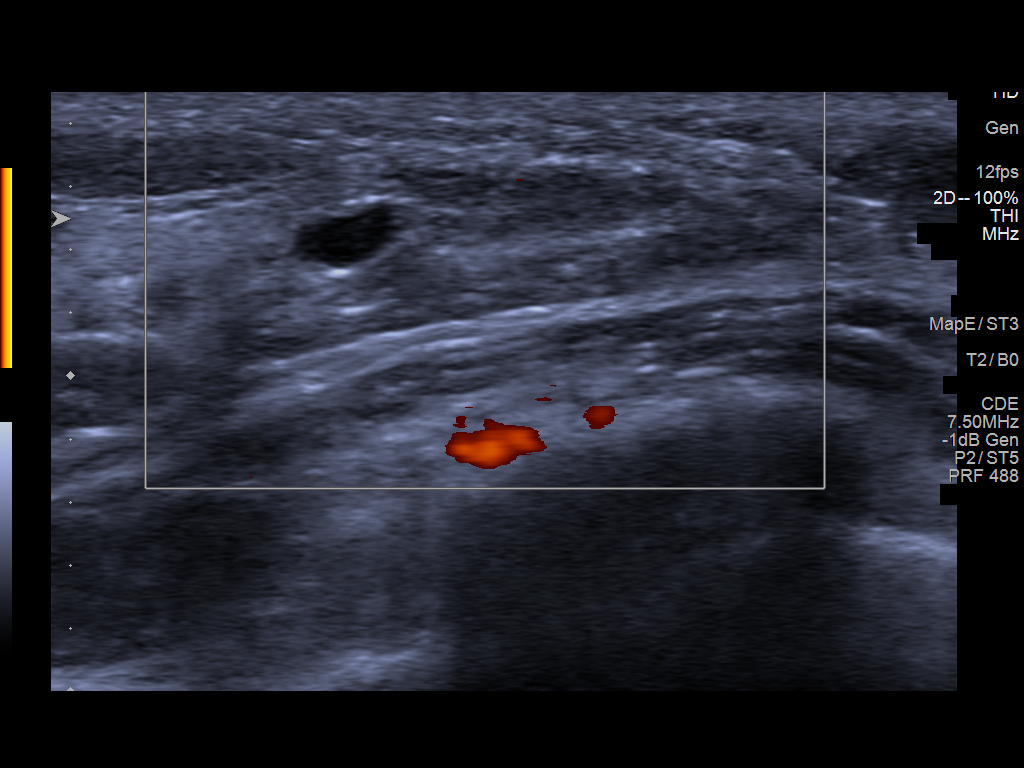
[im 6/6]
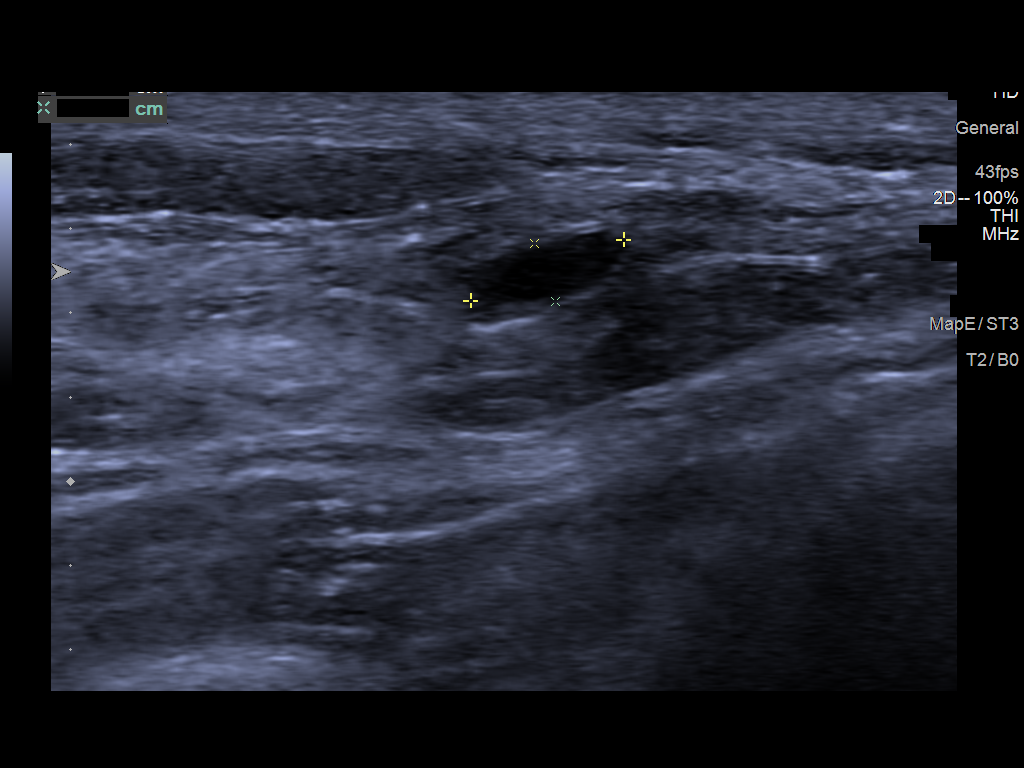

[6 of 6 positions shown; findings below may reference images not displayed]

ACR Breast Density Category c: The breast tissue is heterogeneously
dense, which may obscure small masses.
FINDINGS: Mammogram:

Spot compression tomosynthesis and full field mL tomosynthesis views
of the right breast were performed for a questioned asymmetry seen
only on the MLO view in the inferior right breast. On the additional
imaging an asymmetry persists on the spot MLO view in the inferior
likely slightly lateral aspect of the breast measuring approximately
0.8 cm.

Ultrasound:

Targeted ultrasound is performed throughout the inferior and lateral
aspect of the breast demonstrating an oval circumscribed hypoechoic
mass at 7 o'clock 3 cm from nipple measuring 0.4 x 0.2 x 0.4 cm,
consistent with a benign simple cyst. No suspicious solid mass.
IMPRESSION: Probably benign right breast asymmetry which may correspond to a
benign simple cyst in the right breast at 7 o'clock. There is a
slight size discrepancy in the mammographic and sonographic
findings.

RECOMMENDATION:
Diagnostic right breast mammogram and possible ultrasound in 6
months.

I have discussed the findings and recommendations with the patient.
If applicable, a reminder letter will be sent to the patient
regarding the next appointment.

BI-RADS CATEGORY  3: Probably benign.

## 2021-08-20 ENCOUNTER — Encounter: Payer: Self-pay | Admitting: Gastroenterology

## 2021-08-31 ENCOUNTER — Encounter: Payer: Self-pay | Admitting: Anesthesiology

## 2021-09-03 ENCOUNTER — Encounter: Payer: Self-pay | Admitting: Obstetrics and Gynecology

## 2021-09-03 ENCOUNTER — Other Ambulatory Visit: Payer: Self-pay | Admitting: Obstetrics and Gynecology

## 2021-09-03 MED ORDER — FLUCONAZOLE 150 MG PO TABS: 150.0000 mg | ORAL_TABLET | Freq: Once | ORAL | 0 refills | Status: AC

## 2021-09-03 NOTE — Progress Notes (Signed)
Rx diflucan for yeast vag sx after abx ?

## 2021-09-19 NOTE — Progress Notes (Signed)
PCP:  Betty Rodgers, No Pcp Per (Inactive)   Chief Complaint  Betty Rodgers presents with   Gynecologic Exam   Urinary Tract Infection    Was having painful and frequency urination wants to make sure its clear     HPI:      Betty Rodgers is a 45 y.o. G0P0000 who LMP was Betty Rodgers was 08/16/2016., presents today for Betty Rodgers annual examination.  Betty Rodgers menses are absent due to lap hyst/salpingectomy for menorrhagia and leio 2017 with Dr. Kenton Kingfisher.  She does not have PMB. No vasomotor sx.  Sex activity: single partner, contraception - status post hysterectomy. No pain/bleeding.  Last Pap: 09/08/19  Results were: no abnormalities /neg HPV DNA. No longer recommended with hyst. Hx of STDs: none  Hx of recurrent yeast vag past couple of yrs. Betty Rodgers using dove sens skin soap, no dryer sheets. Wears thongs regularly and doesn't plan to stop. :-) I send diflucan RF in prn sx.   Last mammogram: 10/25/20  Results were: cat 3 RT breast, normal repeat imaging 04/17/21. Due for bilat mammo 12/22 There is no FH of breast cancer. There is no FH of ovarian cancer. Betty Rodgers biological mom died last yr from uterine cancer in Betty Rodgers 55s. The Betty Rodgers does self-breast exams.  Tobacco use: The Betty Rodgers denies current or previous tobacco use. Alcohol use: none No drug use.  Exercise: mod active  Colonoscopy: 2017 with polyps; repeat due after 5 yrs. Dad with stage 4 colon cancer dx, no tx, on hospice. Betty Rodgers has colonoscopy sched for 12/22  She does get adequate calcium and Vitamin D in Betty Rodgers diet.  Normal fasting labs 11/21 and 11/22 (recent labs including thyroid panel, lipids, CMP, HgA1C)  Treated Betty Rodgers for UTI sx 9/22 with sx relief, but Betty Rodgers then continued to have frequency/nocturia/felt like something was "off" in pelvic area, for about 5-6 wks. Sx have since resolved. Had neg UA with labs 11/22. Wants recheck today.    Past Medical History:  Diagnosis Date   Acute abdominal pain in right lower quadrant  03/25/2016   Anxiety disorder 09/27/2015   Anxiety state, unspecified    Chronic lymphocytic thyroiditis    History of Crohn's disease    Irritable bowel syndrome    Other chest pain    Other specified congenital anomaly of skin    Sciatica    Unspecified chronic bronchitis (HCC)    Unspecified sinusitis (chronic)    Vaginitis and vulvovaginitis, unspecified     Past Surgical History:  Procedure Laterality Date   ABDOMINAL HYSTERECTOMY     COLONOSCOPY WITH PROPOFOL N/A 05/09/2016   Procedure: COLONOSCOPY WITH PROPOFOL;  Surgeon: Lucilla Lame, MD;  Location: [facility];  Service: Endoscopy;  Laterality: N/A;  PLEASE LEAVE EARLY AS POSSIBLE   CYSTOSCOPY N/A 08/20/2016   Procedure: CYSTOSCOPY;  Surgeon: Gae Dry, MD;  Location: [facility];  Service: Gynecology;  Laterality: N/A;   LAPAROSCOPIC BILATERAL SALPINGECTOMY Bilateral 08/20/2016   Procedure: LAPAROSCOPIC BILATERAL SALPINGECTOMY;  Surgeon: Gae Dry, MD;  Location: [facility];  Service: Gynecology;  Laterality: Bilateral;   LAPAROSCOPIC HYSTERECTOMY N/A 08/20/2016   Procedure: HYSTERECTOMY TOTAL LAPAROSCOPIC;  Surgeon: Gae Dry, MD;  Location: [facility];  Service: Gynecology;  Laterality: N/A;   POLYPECTOMY  05/09/2016   Procedure: POLYPECTOMY;  Surgeon: Lucilla Lame, MD;  Location: [facility];  Service: Endoscopy;;   WISDOM TOOTH EXTRACTION      Family History  Problem Relation Age of Onset  Cancer Mother 64       ? source   Cancer Father        started in colon and has spreaded   Hypertension Father    Heart disease Father    Diabetes Father        Controlled   Heart attack Paternal Grandmother    Breast cancer Neg Hx     Social History   Socioeconomic History   Marital status: Single    Spouse name: Not on file   Number of children: Not on file   Years of education: Not on file   Highest education level: Not on file  Occupational History   Not on file  Tobacco Use   Smoking  status: Never   Smokeless tobacco: Never  Vaping Use   Vaping Use: Never used  Substance and Sexual Activity   Alcohol use: Not Currently   Drug use: No   Sexual activity: Yes    Partners: Male    Birth control/protection: Surgical    Comment: Hysterectomy  Other Topics Concern   Not on file  Social History Narrative   Not on file   Social Determinants of Health   Financial Resource Strain: Not on file  Food Insecurity: Not on file  Transportation Needs: Not on file  Physical Activity: Not on file  Stress: Not on file  Social Connections: Not on file  Intimate Partner Violence: Not on file    Current Meds  Medication Sig   Ascorbic Acid (VITAMIN C PO) Take 1 tablet by mouth daily.    B Complex Vitamins (VITAMIN B-COMPLEX PO) Take 1 tablet by mouth daily.    Cholecalciferol (VITAMIN D PO) Take 1 tablet by mouth daily.    Omega-3 Fatty Acids (OMEGA 3 PO) Take 1 capsule by mouth daily.    Probiotic Product (PROBIOTIC BLEND PO) Take by mouth.     ROS:  Review of Systems  Constitutional:  Negative for fatigue, fever and unexpected weight change.  Respiratory:  Negative for cough, shortness of breath and wheezing.   Cardiovascular:  Negative for chest pain, palpitations and leg swelling.  Gastrointestinal:  Negative for blood in stool, constipation, diarrhea, nausea and vomiting.  Endocrine: Negative for cold intolerance, heat intolerance and polyuria.  Genitourinary:  Positive for frequency. Negative for dyspareunia, dysuria, flank pain, genital sores, hematuria, menstrual problem, pelvic pain, urgency, vaginal bleeding, vaginal discharge and vaginal pain.  Musculoskeletal:  Negative for back pain, joint swelling and myalgias.  Skin:  Negative for rash.  Neurological:  Negative for dizziness, syncope, light-headedness, numbness and headaches.  Hematological:  Negative for adenopathy.  Psychiatric/Behavioral:  Negative for agitation, confusion, sleep disturbance and  suicidal ideas. The Betty Rodgers is not nervous/anxious.     Objective: BP 110/80   Ht 5' 4"  (1.626 m)   Wt 137 lb (62.1 kg)   LMP 08/16/2016   BMI 23.52 kg/m    Physical Exam Constitutional:      Appearance: She is well-developed.  Genitourinary:     Vulva normal.     Genitourinary Comments: UTERUS/CX SURG REM     Right Labia: No rash, tenderness or lesions.    Left Labia: No tenderness, lesions or rash.    No vaginal discharge, erythema or tenderness.      Right Adnexa: not tender and no mass present.    Left Adnexa: not tender and no mass present.    Cervix is absent.     No cervical friability or polyp.  Uterus is not enlarged or tender.     Uterus is absent.  Breasts:    Right: No mass, nipple discharge, skin change or tenderness.     Left: No mass, nipple discharge, skin change or tenderness.  Neck:     Thyroid: No thyromegaly.  Cardiovascular:     Rate and Rhythm: Normal rate and regular rhythm.     Heart sounds: Normal heart sounds. No murmur heard. Pulmonary:     Effort: Pulmonary effort is normal.     Breath sounds: Normal breath sounds.  Abdominal:     Palpations: Abdomen is soft.     Tenderness: There is no abdominal tenderness. There is no guarding or rebound.  Musculoskeletal:        General: Normal range of motion.     Cervical back: Normal range of motion.  Lymphadenopathy:     Cervical: No cervical adenopathy.  Neurological:     General: No focal deficit present.     Mental Status: She is alert and oriented to person, place, and time.     Cranial Nerves: No cranial nerve deficit.  Skin:    General: Skin is warm and dry.  Psychiatric:        Mood and Affect: Mood normal.        Behavior: Behavior normal.        Thought Content: Thought content normal.        Judgment: Judgment normal.  Vitals reviewed.   Results for orders placed or performed in visit on 09/20/21 (from the past 24 hour(s))  POCT Urinalysis Dipstick     Status: Normal    Collection Time: 09/20/21 10:39 AM  Result Value Ref Range   Color, UA yellow    Clarity, UA clear    Glucose, UA Negative Negative   Bilirubin, UA neg    Ketones, UA neg    Spec Grav, UA 1.020 1.010 - 1.025   Blood, UA neg    pH, UA 6.5 5.0 - 8.0   Protein, UA Negative Negative   Urobilinogen, UA     Nitrite, UA neg    Leukocytes, UA Negative Negative   Appearance     Odor      Assessment/Plan: Encounter for annual routine gynecological examination  Encounter for screening mammogram for malignant neoplasm of breast - Plan: MM 3D SCREEN BREAST BILATERAL; Betty Rodgers to sched mammo  Urinary frequency - Plan: POCT Urinalysis Dipstick; neg UA, sx resolved. Reassurance. Could have been bladder spasms. F/u prn  Yeast vaginitis--will RF diflucan prn. Betty Rodgers aware thongs are prob contributing factor to sx.    GYN counsel mammography screening, adequate intake of calcium and vitamin D, diet and exercise     F/U  Return in about 1 year (around 09/20/2022).  Delylah Stanczyk B. Jozlynn Plaia, PA-C 09/20/2021 10:40 AM

## 2021-09-20 ENCOUNTER — Ambulatory Visit (INDEPENDENT_AMBULATORY_CARE_PROVIDER_SITE_OTHER): Payer: BC Managed Care – PPO | Admitting: Obstetrics and Gynecology

## 2021-09-20 ENCOUNTER — Other Ambulatory Visit: Payer: Self-pay

## 2021-09-20 ENCOUNTER — Encounter: Payer: Self-pay | Admitting: Obstetrics and Gynecology

## 2021-09-20 VITALS — BP 110/80 | Ht 64.0 in | Wt 137.0 lb

## 2021-09-20 DIAGNOSIS — Z01419 Encounter for gynecological examination (general) (routine) without abnormal findings: Secondary | ICD-10-CM

## 2021-09-20 DIAGNOSIS — Z1231 Encounter for screening mammogram for malignant neoplasm of breast: Secondary | ICD-10-CM

## 2021-09-20 DIAGNOSIS — R350 Frequency of micturition: Secondary | ICD-10-CM

## 2021-09-20 LAB — POCT URINALYSIS DIPSTICK
Bilirubin, UA: NEGATIVE
Blood, UA: NEGATIVE
Glucose, UA: NEGATIVE
Ketones, UA: NEGATIVE
Leukocytes, UA: NEGATIVE
Nitrite, UA: NEGATIVE
Protein, UA: NEGATIVE
Spec Grav, UA: 1.02 (ref 1.010–1.025)
pH, UA: 6.5 (ref 5.0–8.0)

## 2021-09-20 NOTE — Patient Instructions (Addendum)
I value your feedback and you entrusting us with your care. If you get a [facility] patient survey, I would appreciate you taking the time to let us know about your experience today. Thank you!  [facility]: [phone]      

## 2021-10-05 ENCOUNTER — Ambulatory Visit: Admit: 2021-10-05 | Payer: BC Managed Care – PPO | Admitting: Gastroenterology

## 2021-10-05 SURGERY — COLONOSCOPY WITH PROPOFOL
Anesthesia: Choice

## 2021-12-31 ENCOUNTER — Other Ambulatory Visit: Payer: Self-pay

## 2021-12-31 ENCOUNTER — Ambulatory Visit
Admission: RE | Admit: 2021-12-31 | Discharge: 2021-12-31 | Disposition: A | Discharge status: Home / Self Care | Payer: BC Managed Care – PPO | Source: Ambulatory Visit | Attending: Obstetrics and Gynecology | Admitting: Obstetrics and Gynecology

## 2021-12-31 DIAGNOSIS — Z1231 Encounter for screening mammogram for malignant neoplasm of breast: Secondary | ICD-10-CM | POA: Insufficient documentation

## 2021-12-31 DIAGNOSIS — Z86010 Personal history of colonic polyps: Secondary | ICD-10-CM

## 2021-12-31 NOTE — Progress Notes (Signed)
Patient has the prep already

## 2021-12-31 NOTE — Progress Notes (Signed)
Gastroenterology Pre-Procedure Review  Request Date: 05/03/2022 Requesting Physician: Dr. Allen Norris  PATIENT REVIEW QUESTIONS: The patient responded to the following health history questions as indicated:    1. Are you having any GI issues? no 2. Do you have a personal history of Polyps? yes (last colonoscopy) 3. Do you have a family history of Colon Cancer or Polyps? yes (dad colon cancer) 4. Diabetes Mellitus? no 5. Joint replacements in the past 12 months?no 6. Major health problems in the past 3 months?no 7. Any artificial heart valves, MVP, or defibrillator?no    MEDICATIONS & ALLERGIES:    Patient reports the following regarding taking any anticoagulation/antiplatelet therapy:   Plavix, Coumadin, Eliquis, Xarelto, Lovenox, Pradaxa, Brilinta, or Effient? no Aspirin? no  Patient confirms/reports the following medications:  Current Outpatient Medications  Medication Sig Dispense Refill   Ascorbic Acid (VITAMIN C PO) Take 1 tablet by mouth daily.      B Complex Vitamins (VITAMIN B-COMPLEX PO) Take 1 tablet by mouth daily.      Cholecalciferol (VITAMIN D PO) Take 1 tablet by mouth daily.      Na Sulfate-K Sulfate-Mg Sulf (SUPREP BOWEL PREP KIT) 17.5-3.13-1.6 GM/177ML SOLN Take 1 kit by mouth as directed. 354 mL 0   Omega-3 Fatty Acids (OMEGA 3 PO) Take 1 capsule by mouth daily.      Probiotic Product (PROBIOTIC BLEND PO) Take by mouth.     No current facility-administered medications for this visit.    Patient confirms/reports the following allergies:  Allergies  Allergen Reactions   Bee Venom     No orders of the defined types were placed in this encounter.   AUTHORIZATION INFORMATION Primary Insurance: 1D#: Group #:  Secondary Insurance: 1D#: Group #:  SCHEDULE INFORMATION: Date: 05/03/2022 Time: Location:[facility]

## 2022-01-13 ENCOUNTER — Encounter: Payer: Self-pay | Admitting: Obstetrics and Gynecology

## 2022-01-14 ENCOUNTER — Encounter: Payer: Self-pay | Admitting: Obstetrics and Gynecology

## 2022-01-14 ENCOUNTER — Other Ambulatory Visit: Payer: Self-pay

## 2022-01-14 ENCOUNTER — Ambulatory Visit (INDEPENDENT_AMBULATORY_CARE_PROVIDER_SITE_OTHER): Payer: BC Managed Care – PPO | Admitting: Obstetrics and Gynecology

## 2022-01-14 VITALS — BP 120/70 | Ht 64.0 in | Wt 143.0 lb

## 2022-01-14 DIAGNOSIS — N6341 Unspecified lump in right breast, subareolar: Secondary | ICD-10-CM | POA: Diagnosis not present

## 2022-01-14 NOTE — Telephone Encounter (Signed)
Pt calling to talk c [facility] about pain in her breast; will [facility] be able to help her or be a go between?  Pt works and would rather just see the person that can help her instead of making two appts.  [phone] (pt has appt today at 3:15 c [facility] for this.). ?

## 2022-01-14 NOTE — Progress Notes (Signed)
? ? ?Patient, No Pcp Per (Inactive) ? ? ?Chief Complaint  ?Patient presents with  ? Breast Exam  ?  Pain in RB only x 10 days  ? ? ?HPI: ?     [name] is a 46 y.o. G0P0000 whose LMP was Patient's last menstrual period was 08/16/2016., presents today for pain in her RT breast for 10 days. Started about 3 days after neg screening mammo 12/31/21. RT breast started to itch and then developed stabbing pains. RT nipple is hard, hurts to touch or have anything touch it, hurts to lie on LT side. Cold makes it worse, hasn't tried heating pad/NSAIDs. No caffeine use. No erythema, nipple d/c, trauma prior to sx. No hx of breast issues in past. No FH breast/ovar ca.  ?Her menses are absent due to lap hyst/salpingectomy for menorrhagia and leio 2017 with Dr. Kenton Kingfisher.  She does not have PMB.  ? ?Patient Active Problem List  ? Diagnosis Date Noted  ? Subareolar mass of right breast 01/14/2022  ? Carpal tunnel syndrome 07/24/2021  ? Candidal vaginitis 09/18/2020  ? Diarrhea 02/11/2018  ? Dizziness 02/11/2018  ? Acute cystitis without hematuria 02/11/2018  ? Family history of diabetes mellitus in father 10/29/2016  ? Well woman exam without gynecological exam 10/29/2016  ? Fibroid uterus 08/20/2016  ? Abnormal findings-gastrointestinal tract   ? Other specified diseases of intestine   ? Benign neoplasm of transverse colon   ? Rectal polyp   ? Acute abdominal pain in right lower quadrant 03/25/2016  ? Anxiety disorder 09/27/2015  ? ? ?Past Surgical History:  ?Procedure Laterality Date  ? ABDOMINAL HYSTERECTOMY    ? COLONOSCOPY WITH PROPOFOL N/A 05/09/2016  ? Procedure: COLONOSCOPY WITH PROPOFOL;  Surgeon: Lucilla Lame, MD;  Location: [place];  Service: Endoscopy;  Laterality: N/A;  PLEASE LEAVE EARLY AS POSSIBLE  ? CYSTOSCOPY N/A 08/20/2016  ? Procedure: CYSTOSCOPY;  Surgeon: Gae Dry, MD;  Location: [facility];  Service: Gynecology;  Laterality: N/A;  ? LAPAROSCOPIC BILATERAL SALPINGECTOMY Bilateral  08/20/2016  ? Procedure: LAPAROSCOPIC BILATERAL SALPINGECTOMY;  Surgeon: Gae Dry, MD;  Location: [facility];  Service: Gynecology;  Laterality: Bilateral;  ? LAPAROSCOPIC HYSTERECTOMY N/A 08/20/2016  ? Procedure: HYSTERECTOMY TOTAL LAPAROSCOPIC;  Surgeon: Gae Dry, MD;  Location: [facility];  Service: Gynecology;  Laterality: N/A;  ? POLYPECTOMY  05/09/2016  ? Procedure: POLYPECTOMY;  Surgeon: Lucilla Lame, MD;  Location: [place];  Service: Endoscopy;;  ? WISDOM TOOTH EXTRACTION    ? ? ?Family History  ?Problem Relation Age of Onset  ? Uterine cancer Mother   ?     91s  ? Cancer Father   ?     started in colon and has spreaded  ? Hypertension Father   ? Heart disease Father   ? Diabetes Father   ?     Controlled  ? Heart attack Paternal Grandmother   ? Breast cancer Neg Hx   ? ? ?Social History  ? ?Socioeconomic History  ? Marital status: Single  ?  Spouse name: Not on file  ? Number of children: Not on file  ? Years of education: Not on file  ? Highest education level: Not on file  ?Occupational History  ? Not on file  ?Tobacco Use  ? Smoking status: Never  ? Smokeless tobacco: Never  ?Vaping Use  ? Vaping Use: Never used  ?Substance and Sexual Activity  ? Alcohol use: Not Currently  ? Drug use:  No  ? Sexual activity: Yes  ?  Partners: Male  ?  Birth control/protection: Surgical  ?  Comment: Hysterectomy  ?Other Topics Concern  ? Not on file  ?Social History Narrative  ? Not on file  ? ?Social Determinants of Health  ? ?Financial Resource Strain: Not on file  ?Food Insecurity: Not on file  ?Transportation Needs: Not on file  ?Physical Activity: Not on file  ?Stress: Not on file  ?Social Connections: Not on file  ?Intimate Partner Violence: Not on file  ? ? ?Outpatient Medications Prior to Visit  ?Medication Sig Dispense Refill  ? Ascorbic Acid (VITAMIN C PO) Take 1 tablet by mouth daily.     ? B Complex Vitamins (VITAMIN B-COMPLEX PO) Take 1 tablet by mouth daily.     ? Cholecalciferol (VITAMIN  D PO) Take 1 tablet by mouth daily.     ? Omega-3 Fatty Acids (OMEGA 3 PO) Take 1 capsule by mouth daily.     ? Probiotic Product (PROBIOTIC BLEND PO) Take by mouth.    ? Na Sulfate-K Sulfate-Mg Sulf (SUPREP BOWEL PREP KIT) 17.5-3.13-1.6 GM/177ML SOLN Take 1 kit by mouth as directed. (Patient not taking: Reported on 01/14/2022) 354 mL 0  ? ?No facility-administered medications prior to visit.  ? ? ? ? ?ROS: ? ?Review of Systems  ?Constitutional:  Negative for fever.  ?Gastrointestinal:  Negative for blood in stool, constipation, diarrhea, nausea and vomiting.  ?Genitourinary:  Negative for dyspareunia, dysuria, flank pain, frequency, hematuria, urgency, vaginal bleeding, vaginal discharge and vaginal pain.  ?Musculoskeletal:  Negative for back pain.  ?Skin:  Negative for rash.  ?BREAST: pain ? ? ?OBJECTIVE:  ? ?Vitals:  ?BP 120/70   Ht 5' 4"  (1.626 m)   Wt 143 lb (64.9 kg)   LMP 08/16/2016   BMI 24.55 kg/m?  ? ?Physical Exam ?Vitals reviewed.  ?Pulmonary:  ?   Effort: Pulmonary effort is normal.  ?Chest:  ?Breasts: ?   Breasts are symmetrical.  ?   Right: No inverted nipple, mass, nipple discharge, skin change or tenderness.  ?   Left: No inverted nipple, mass, nipple discharge, skin change or tenderness.  ? ? ?Musculoskeletal:     ?   General: Normal range of motion.  ?   Cervical back: Normal range of motion.  ?Skin: ?   General: Skin is warm and dry.  ?Neurological:  ?   General: No focal deficit present.  ?   Mental Status: She is alert and oriented to person, place, and time.  ?   Cranial Nerves: No cranial nerve deficit.  ?Psychiatric:     ?   Mood and Affect: Mood normal.     ?   Behavior: Behavior normal.     ?   Thought Content: Thought content normal.     ?   Judgment: Judgment normal.  ? ? ?Assessment/Plan: ?Subareolar mass of right breast--given neg mammo a few days before sx started and given rapid size of sx, most likely cyst vs fibroadenoma. No evid of abscess. Refer to Dr. Bary Castilla for eval/mgmt.  NSAIDs/heating pad for sx control in meantime.  ? ? ? Return if symptoms worsen or fail to improve. ? ?Larrell Rapozo B. Bartt Gonzaga, PA-C ?01/14/2022 ?4:10 PM ? ? ? ? ? ?

## 2022-01-15 ENCOUNTER — Telehealth: Payer: Self-pay

## 2022-01-15 NOTE — Telephone Encounter (Signed)
Melanie from Dr. Dwyane Luo office calling; pt's appt has been changed to 3/15th at 1:15pm; pt aware. [phone] ?

## 2022-01-15 NOTE — Telephone Encounter (Signed)
Ok

## 2022-01-16 DIAGNOSIS — N611 Abscess of the breast and nipple: Secondary | ICD-10-CM | POA: Diagnosis not present

## 2022-01-24 DIAGNOSIS — N611 Abscess of the breast and nipple: Secondary | ICD-10-CM | POA: Diagnosis not present

## 2022-02-12 DIAGNOSIS — N611 Abscess of the breast and nipple: Secondary | ICD-10-CM | POA: Diagnosis not present

## 2022-02-19 DIAGNOSIS — N611 Abscess of the breast and nipple: Secondary | ICD-10-CM | POA: Diagnosis not present

## 2022-02-25 ENCOUNTER — Other Ambulatory Visit: Payer: Self-pay

## 2022-02-25 ENCOUNTER — Other Ambulatory Visit: Payer: Self-pay | Admitting: General Surgery

## 2022-02-25 ENCOUNTER — Encounter
Admission: RE | Admit: 2022-02-25 | Discharge: 2022-02-25 | Disposition: A | Discharge status: Home / Self Care | Payer: BC Managed Care – PPO | Source: Ambulatory Visit | Attending: General Surgery | Admitting: General Surgery

## 2022-02-25 DIAGNOSIS — N611 Abscess of the breast and nipple: Secondary | ICD-10-CM

## 2022-02-25 HISTORY — DX: Acquired absence of both cervix and uterus: Z90.710

## 2022-02-25 NOTE — Patient Instructions (Addendum)
Your procedure is scheduled on: Wednesday 02/27/22 ?Report to the Registration Desk on the 1st floor of the [facility]. ?To find out your arrival time, please call [phone] between 1PM - 3PM on: Tuesday 02/26/22 ? ?REMEMBER: ?Instructions that are not followed completely may result in serious medical risk, up to and including death; or upon the discretion of your surgeon and anesthesiologist your surgery may need to be rescheduled. ? ?Do not eat food after midnight the night before surgery.  ?No gum chewing, lozengers or hard candies. ? ?You may however, drink CLEAR liquids up to 2 hours before you are scheduled to arrive for your surgery. Do not drink anything within 2 hours of your scheduled arrival time. ? ?Clear liquids include: ?- water  ?- apple juice without pulp ?- gatorade (not RED colors) ?- black coffee or tea (Do NOT add milk or creamers to the coffee or tea) ?Do NOT drink anything that is not on this list. ? ?TAKE THESE MEDICATIONS THE MORNING OF SURGERY WITH A SIP OF WATER: ?NONE ? ?One week prior to surgery: ?Stop Anti-inflammatories (NSAIDS) such as Advil, Aleve, Ibuprofen, Motrin, Naproxen, Naprosyn and Aspirin based products such as Excedrin, Goodys Powder, BC Powder. ? ?Stop taking your Ascorbic Acid (VITAMIN C PO), B Complex Vitamins (VITAMIN B-COMPLEX PO), Cholecalciferol (VITAMIN D PO), Omega-3 Fatty Acids (OMEGA 3 PO), Probiotic Product (PROBIOTIC BLEND PO) and ANY OVER THE COUNTER supplements until after surgery. ? ?You may however, continue to take Tylenol if needed for pain up until the day of surgery. ? ?No Alcohol for 24 hours before or after surgery. ? ?No Smoking including e-cigarettes for 24 hours prior to surgery.  ?No chewable tobacco products for at least 6 hours prior to surgery.  ?No nicotine patches on the day of surgery. ? ?Do not use any "recreational" drugs for at least a week prior to your surgery.  ?Please be advised that the combination of cocaine and anesthesia may  have negative outcomes, up to and including death. ?If you test positive for cocaine, your surgery will be cancelled. ? ?On the morning of surgery brush your teeth with toothpaste and water, you may rinse your mouth with mouthwash if you wish. ?Do not swallow any toothpaste or mouthwash. ? ?Use Dial Soap the night prior to and the morning of the surgery. Put on clean pajamas and a clean sheet on the bed the night before surgery. ? ?Do not wear jewelry, make-up, hairpins, clips or nail polish. ? ?Do not wear lotions, powders, or perfumes.  ? ?Do not shave body from the neck down 48 hours prior to surgery just in case you cut yourself which could leave a site for infection.  ? ?Do not bring valuables to the hospital. [facility] is not responsible for any missing/lost belongings or valuables.  ? ?Notify your doctor if there is any change in your medical condition (cold, fever, infection). ? ?Wear comfortable clothing (specific to your surgery type) to the hospital. ? ?After surgery, you can help prevent lung complications by doing breathing exercises.  ?Take deep breaths and cough every 1-2 hours.  ? ?If you are being discharged the day of surgery, you will not be allowed to drive home. ?You will need a responsible adult (18 years or older) to drive you home and stay with you that night.  ? ?If you are taking public transportation, you will need to have a responsible adult (18 years or older) with you. ?Please confirm with your physician that  it is acceptable to use public transportation.  ? ?Please call the [facility] at [phone] if you have any questions about these instructions. ? ?Surgery Visitation Policy: ? ?Patients undergoing a surgery or procedure may have two family members or support persons with them as long as the person is not COVID-19 positive or experiencing its symptoms.  ? ?Inpatient Visitation:   ? ?Visiting hours are 7 a.m. to 8 p.m. ?Up to four visitors are allowed at one  time in a patient room, including children. The visitors may rotate out with other people during the day. One designated support person (adult) may remain overnight.  ?

## 2022-02-26 MED ORDER — ORAL CARE MOUTH RINSE: 15.0000 mL | Freq: Once | OROMUCOSAL | Status: AC

## 2022-02-26 MED ORDER — FAMOTIDINE 20 MG PO TABS: 20.0000 mg | ORAL_TABLET | Freq: Once | ORAL | Status: AC

## 2022-02-26 MED ORDER — CHLORHEXIDINE GLUCONATE 0.12 % MT SOLN: 15.0000 mL | Freq: Once | OROMUCOSAL | Status: AC

## 2022-02-26 MED ORDER — CHLORHEXIDINE GLUCONATE CLOTH 2 % EX PADS
6.0000 | MEDICATED_PAD | Freq: Once | CUTANEOUS | Status: AC
  Administered 2022-02-27: 6 via TOPICAL

## 2022-02-27 ENCOUNTER — Ambulatory Visit: Payer: BC Managed Care – PPO | Admitting: Anesthesiology

## 2022-02-27 ENCOUNTER — Other Ambulatory Visit: Payer: Self-pay

## 2022-02-27 ENCOUNTER — Encounter: Payer: Self-pay | Admitting: General Surgery

## 2022-02-27 ENCOUNTER — Encounter
Admission: RE | Disposition: A | Discharge status: Home / Self Care | Payer: Self-pay | Source: Home / Self Care | Attending: General Surgery

## 2022-02-27 ENCOUNTER — Ambulatory Visit
Admission: RE | Admit: 2022-02-27 | Discharge: 2022-02-27 | Disposition: A | Discharge status: Home / Self Care | Payer: BC Managed Care – PPO | Attending: General Surgery | Admitting: General Surgery

## 2022-02-27 DIAGNOSIS — K5090 Crohn's disease, unspecified, without complications: Secondary | ICD-10-CM | POA: Diagnosis not present

## 2022-02-27 DIAGNOSIS — N611 Abscess of the breast and nipple: Secondary | ICD-10-CM | POA: Diagnosis not present

## 2022-02-27 DIAGNOSIS — N6341 Unspecified lump in right breast, subareolar: Secondary | ICD-10-CM | POA: Diagnosis not present

## 2022-02-27 HISTORY — PX: INCISION AND DRAINAGE ABSCESS: SHX5864

## 2022-02-27 LAB — CBC WITH DIFFERENTIAL/PLATELET
Abs Immature Granulocytes: 0.04 10*3/uL (ref 0.00–0.07)
Basophils Absolute: 0 10*3/uL (ref 0.0–0.1)
Basophils Relative: 0 %
Eosinophils Absolute: 0.1 10*3/uL (ref 0.0–0.5)
Eosinophils Relative: 2 %
HCT: 44.1 % (ref 36.0–46.0)
Hemoglobin: 15.1 g/dL — ABNORMAL HIGH (ref 12.0–15.0)
Immature Granulocytes: 0 %
Lymphocytes Relative: 31 %
Lymphs Abs: 2.9 10*3/uL (ref 0.7–4.0)
MCH: 29.9 pg (ref 26.0–34.0)
MCHC: 34.2 g/dL (ref 30.0–36.0)
MCV: 87.3 fL (ref 80.0–100.0)
Monocytes Absolute: 0.5 10*3/uL (ref 0.1–1.0)
Monocytes Relative: 5 %
Neutro Abs: 5.8 10*3/uL (ref 1.7–7.7)
Neutrophils Relative %: 62 %
Platelets: 298 10*3/uL (ref 150–400)
RBC: 5.05 MIL/uL (ref 3.87–5.11)
RDW: 11.6 % (ref 11.5–15.5)
WBC: 9.4 10*3/uL (ref 4.0–10.5)
nRBC: 0 % (ref 0.0–0.2)

## 2022-02-27 SURGERY — INCISION AND DRAINAGE, ABSCESS
Anesthesia: General | Site: Breast | Laterality: Right

## 2022-02-27 MED ORDER — FENTANYL CITRATE (PF) 100 MCG/2ML IJ SOLN
INTRAMUSCULAR | Status: AC
  Filled 2022-02-27: qty 2

## 2022-02-27 MED ORDER — PROPOFOL 10 MG/ML IV BOLUS
INTRAVENOUS | Status: AC
  Filled 2022-02-27: qty 20

## 2022-02-27 MED ORDER — FENTANYL CITRATE (PF) 100 MCG/2ML IJ SOLN
INTRAMUSCULAR | Status: DC | PRN
  Administered 2022-02-27 (×2): 25 ug via INTRAVENOUS
  Administered 2022-02-27: 50 ug via INTRAVENOUS

## 2022-02-27 MED ORDER — OXYCODONE HCL 5 MG/5ML PO SOLN: 5.0000 mg | Freq: Once | ORAL | Status: AC | PRN

## 2022-02-27 MED ORDER — CHLORHEXIDINE GLUCONATE 0.12 % MT SOLN
OROMUCOSAL | Status: AC
  Administered 2022-02-27: 15 mL via OROMUCOSAL
  Filled 2022-02-27: qty 15

## 2022-02-27 MED ORDER — LACTATED RINGERS IV SOLN: INTRAVENOUS | Status: DC

## 2022-02-27 MED ORDER — OXYCODONE HCL 5 MG PO TABS: 5.0000 mg | ORAL_TABLET | Freq: Once | ORAL | Status: AC | PRN

## 2022-02-27 MED ORDER — DEXMEDETOMIDINE HCL IN NACL 200 MCG/50ML IV SOLN
INTRAVENOUS | Status: DC | PRN
  Administered 2022-02-27: 8 ug via INTRAVENOUS
  Administered 2022-02-27: 4 ug via INTRAVENOUS

## 2022-02-27 MED ORDER — ACETAMINOPHEN 10 MG/ML IV SOLN
INTRAVENOUS | Status: AC
  Filled 2022-02-27: qty 100

## 2022-02-27 MED ORDER — DEXAMETHASONE SODIUM PHOSPHATE 10 MG/ML IJ SOLN
INTRAMUSCULAR | Status: AC
  Filled 2022-02-27: qty 1

## 2022-02-27 MED ORDER — HYDROCODONE-ACETAMINOPHEN 5-325 MG PO TABS: 1.0000 | ORAL_TABLET | ORAL | 0 refills | Status: DC | PRN

## 2022-02-27 MED ORDER — ONDANSETRON HCL 4 MG/2ML IJ SOLN
INTRAMUSCULAR | Status: DC | PRN
  Administered 2022-02-27: 4 mg via INTRAVENOUS

## 2022-02-27 MED ORDER — ACETAMINOPHEN 10 MG/ML IV SOLN: 1000.0000 mg | Freq: Once | INTRAVENOUS | Status: DC | PRN

## 2022-02-27 MED ORDER — ACETAMINOPHEN 10 MG/ML IV SOLN
INTRAVENOUS | Status: DC | PRN
  Administered 2022-02-27: 1000 mg via INTRAVENOUS

## 2022-02-27 MED ORDER — ONDANSETRON HCL 4 MG/2ML IJ SOLN
INTRAMUSCULAR | Status: AC
  Filled 2022-02-27: qty 2

## 2022-02-27 MED ORDER — PHENYLEPHRINE HCL (PRESSORS) 10 MG/ML IV SOLN
INTRAVENOUS | Status: DC | PRN
  Administered 2022-02-27 (×2): 80 ug via INTRAVENOUS
  Administered 2022-02-27: 160 ug via INTRAVENOUS

## 2022-02-27 MED ORDER — DEXAMETHASONE SODIUM PHOSPHATE 10 MG/ML IJ SOLN
INTRAMUSCULAR | Status: DC | PRN
  Administered 2022-02-27: 10 mg via INTRAVENOUS

## 2022-02-27 MED ORDER — PROPOFOL 10 MG/ML IV BOLUS
INTRAVENOUS | Status: DC | PRN
  Administered 2022-02-27: 150 mg via INTRAVENOUS
  Administered 2022-02-27: 50 mg via INTRAVENOUS

## 2022-02-27 MED ORDER — MIDAZOLAM HCL 2 MG/2ML IJ SOLN
INTRAMUSCULAR | Status: AC
  Filled 2022-02-27: qty 2

## 2022-02-27 MED ORDER — LIDOCAINE HCL (CARDIAC) PF 100 MG/5ML IV SOSY
PREFILLED_SYRINGE | INTRAVENOUS | Status: DC | PRN
  Administered 2022-02-27: 50 mg via INTRAVENOUS

## 2022-02-27 MED ORDER — FAMOTIDINE 20 MG PO TABS
ORAL_TABLET | ORAL | Status: AC
  Administered 2022-02-27: 20 mg via ORAL
  Filled 2022-02-27: qty 1

## 2022-02-27 MED ORDER — SODIUM CHLORIDE 0.9 % IR SOLN
Status: DC | PRN
  Administered 2022-02-27: 500 mL

## 2022-02-27 MED ORDER — BUPIVACAINE-EPINEPHRINE 0.5% -1:200000 IJ SOLN
INTRAMUSCULAR | Status: DC | PRN
  Administered 2022-02-27: 10 mL

## 2022-02-27 MED ORDER — MIDAZOLAM HCL 2 MG/2ML IJ SOLN
INTRAMUSCULAR | Status: DC | PRN
  Administered 2022-02-27: 2 mg via INTRAVENOUS

## 2022-02-27 MED ORDER — KETOROLAC TROMETHAMINE 30 MG/ML IJ SOLN
INTRAMUSCULAR | Status: DC | PRN
  Administered 2022-02-27: 30 mg via INTRAVENOUS

## 2022-02-27 MED ORDER — GLYCOPYRROLATE 0.2 MG/ML IJ SOLN
INTRAMUSCULAR | Status: AC
  Filled 2022-02-27: qty 1

## 2022-02-27 MED ORDER — OXYCODONE HCL 5 MG PO TABS
ORAL_TABLET | ORAL | Status: AC
  Administered 2022-02-27: 5 mg via ORAL
  Filled 2022-02-27: qty 1

## 2022-02-27 MED ORDER — GLYCOPYRROLATE 0.2 MG/ML IJ SOLN
INTRAMUSCULAR | Status: DC | PRN
  Administered 2022-02-27: .2 mg via INTRAVENOUS

## 2022-02-27 MED ORDER — BUPIVACAINE-EPINEPHRINE (PF) 0.5% -1:200000 IJ SOLN
INTRAMUSCULAR | Status: AC
  Filled 2022-02-27: qty 30

## 2022-02-27 MED ORDER — ONDANSETRON HCL 4 MG/2ML IJ SOLN: 4.0000 mg | Freq: Once | INTRAMUSCULAR | Status: DC | PRN

## 2022-02-27 MED ORDER — FENTANYL CITRATE (PF) 100 MCG/2ML IJ SOLN
25.0000 ug | INTRAMUSCULAR | Status: DC | PRN
  Administered 2022-02-27: 50 ug via INTRAVENOUS

## 2022-02-27 MED ORDER — KETOROLAC TROMETHAMINE 30 MG/ML IJ SOLN
INTRAMUSCULAR | Status: AC
  Filled 2022-02-27: qty 1

## 2022-02-27 SURGICAL SUPPLY — 36 items
BINDER BREAST MEDIUM (GAUZE/BANDAGES/DRESSINGS) ×1 IMPLANT
BLADE SURG 15 STRL LF DISP TIS (BLADE) IMPLANT
BLADE SURG 15 STRL SS (BLADE) ×1
BLADE SURG 15 STRL SS SAFETY (BLADE) ×2 IMPLANT
BULB RESERV EVAC DRAIN JP 100C (MISCELLANEOUS) IMPLANT
CHLORAPREP W/TINT 26 (MISCELLANEOUS) ×2 IMPLANT
COVER PROBE FLX POLY STRL (MISCELLANEOUS) ×1 IMPLANT
DRAIN CHANNEL JP 15F RND 16 (MISCELLANEOUS) IMPLANT
DRAIN PENROSE 12X.25 LTX STRL (MISCELLANEOUS) ×1 IMPLANT
DRAPE LAPAROTOMY 100X77 ABD (DRAPES) ×2 IMPLANT
DRSG GAUZE FLUFF 36X18 (GAUZE/BANDAGES/DRESSINGS) ×1 IMPLANT
DRSG TELFA 4X3 1S NADH ST (GAUZE/BANDAGES/DRESSINGS) ×2 IMPLANT
ELECT REM PT RETURN 9FT ADLT (ELECTROSURGICAL) ×2
ELECTRODE REM PT RTRN 9FT ADLT (ELECTROSURGICAL) ×1 IMPLANT
GAUZE 4X4 16PLY ~~LOC~~+RFID DBL (SPONGE) ×2 IMPLANT
GLOVE SURG ENC MOIS LTX SZ7.5 (GLOVE) ×2 IMPLANT
GLOVE SURG UNDER LTX SZ8 (GLOVE) ×2 IMPLANT
GOWN STRL REUS W/ TWL LRG LVL3 (GOWN DISPOSABLE) ×2 IMPLANT
GOWN STRL REUS W/TWL LRG LVL3 (GOWN DISPOSABLE) ×2
KIT TURNOVER KIT A (KITS) ×2 IMPLANT
LABEL OR SOLS (LABEL) ×2 IMPLANT
MANIFOLD NEPTUNE II (INSTRUMENTS) ×2 IMPLANT
NDL HYPO 25X1 1.5 SAFETY (NEEDLE) ×1 IMPLANT
NEEDLE HYPO 22GX1.5 SAFETY (NEEDLE) ×2 IMPLANT
NEEDLE HYPO 25X1 1.5 SAFETY (NEEDLE) ×2 IMPLANT
PACK BASIN MINOR ARMC (MISCELLANEOUS) ×2 IMPLANT
PENCIL ELECTRO HAND CTR (MISCELLANEOUS) ×1 IMPLANT
STRIP CLOSURE SKIN 1/2X4 (GAUZE/BANDAGES/DRESSINGS) ×1 IMPLANT
SUT ETHILON 3-0 FS-10 30 BLK (SUTURE) ×2
SUT ETHILON 3-0 KS 30 BLK (SUTURE) ×1 IMPLANT
SUT VIC AB 3-0 SH 27 (SUTURE) ×2
SUT VIC AB 3-0 SH 27X BRD (SUTURE) ×2 IMPLANT
SUTURE EHLN 3-0 FS-10 30 BLK (SUTURE) IMPLANT
SWABSTK COMLB BENZOIN TINCTURE (MISCELLANEOUS) ×1 IMPLANT
SYR 10ML LL (SYRINGE) ×2 IMPLANT
WATER STERILE IRR 500ML POUR (IV SOLUTION) ×2 IMPLANT

## 2022-02-27 NOTE — H&P (Signed)
Betty Rodgers ?[id number] ?Mar 16, 1976 ? ?  ? ?HPI: 46 y/o with recurrent persistent right breast abscess. For operative drainage.  ? ?Medications Prior to Admission  ?Medication Sig Dispense Refill Last Dose  ? Ascorbic Acid (VITAMIN C PO) Take 1 tablet by mouth every evening.   02/25/2022  ? Cholecalciferol (VITAMIN D PO) Take 1 tablet by mouth every evening.   02/24/2022  ? Cyanocobalamin (VITAMIN B-12 PO) Take 1 tablet by mouth every evening.   02/24/2022  ? ELDERBERRY PO Take 1 capsule by mouth every evening.   02/24/2022  ? ibuprofen (ADVIL) 200 MG tablet Take 400 mg by mouth every 8 (eight) hours as needed (for pain.).   02/23/2022  ? Omega-3 Fatty Acids (OMEGA 3 PO) Take 1 capsule by mouth every evening.   02/24/2022  ? Probiotic Product (PROBIOTIC BLEND PO) Take 1 capsule by mouth every evening.   02/26/2022  ? sulfamethoxazole-trimethoprim (BACTRIM DS) 800-160 MG tablet Take 1 tablet by mouth in the morning and at bedtime.   02/26/2022  ? Na Sulfate-K Sulfate-Mg Sulf (SUPREP BOWEL PREP KIT) 17.5-3.13-1.6 GM/177ML SOLN Take 1 kit by mouth as directed. (Patient not taking: Reported on 01/14/2022) 354 mL 0   ? ?Allergies  ?Allergen Reactions  ? Bee Venom Swelling  ? ?Past Medical History:  ?Diagnosis Date  ? Acute abdominal pain in right lower quadrant 03/25/2016  ? Anxiety disorder 09/27/2015  ? Anxiety state, unspecified   ? Chronic lymphocytic thyroiditis   ? H/O: hysterectomy   ? History of Crohn's disease   ? Irritable bowel syndrome   ? Other chest pain   ? Other specified congenital anomaly of skin   ? Sciatica   ? Unspecified chronic bronchitis ([place])   ? Unspecified sinusitis (chronic)   ? Vaginitis and vulvovaginitis, unspecified   ? ?Past Surgical History:  ?Procedure Laterality Date  ? ABDOMINAL HYSTERECTOMY    ? COLONOSCOPY WITH PROPOFOL N/A 05/09/2016  ? Procedure: COLONOSCOPY WITH PROPOFOL;  Surgeon: Lucilla Lame, MD;  Location: [place];  Service: Endoscopy;  Laterality: N/A;  PLEASE LEAVE EARLY  AS POSSIBLE  ? CYSTOSCOPY N/A 08/20/2016  ? Procedure: CYSTOSCOPY;  Surgeon: Gae Dry, MD;  Location: [facility];  Service: Gynecology;  Laterality: N/A;  ? LAPAROSCOPIC BILATERAL SALPINGECTOMY Bilateral 08/20/2016  ? Procedure: LAPAROSCOPIC BILATERAL SALPINGECTOMY;  Surgeon: Gae Dry, MD;  Location: [facility];  Service: Gynecology;  Laterality: Bilateral;  ? LAPAROSCOPIC HYSTERECTOMY N/A 08/20/2016  ? Procedure: HYSTERECTOMY TOTAL LAPAROSCOPIC;  Surgeon: Gae Dry, MD;  Location: [facility];  Service: Gynecology;  Laterality: N/A;  ? POLYPECTOMY  05/09/2016  ? Procedure: POLYPECTOMY;  Surgeon: Lucilla Lame, MD;  Location: [place];  Service: Endoscopy;;  ? WISDOM TOOTH EXTRACTION    ? ?Social History  ? ?Socioeconomic History  ? Marital status: Single  ?  Spouse name: Not on file  ? Number of children: Not on file  ? Years of education: Not on file  ? Highest education level: Not on file  ?Occupational History  ? Not on file  ?Tobacco Use  ? Smoking status: Never  ? Smokeless tobacco: Never  ?Vaping Use  ? Vaping Use: Never used  ?Substance and Sexual Activity  ? Alcohol use: Not Currently  ? Drug use: No  ? Sexual activity: Yes  ?  Partners: Male  ?  Birth control/protection: Surgical  ?  Comment: Hysterectomy  ?Other Topics Concern  ? Not on file  ?Social History Narrative  ? Not  on file  ? ?Social Determinants of Health  ? ?Financial Resource Strain: Not on file  ?Food Insecurity: Not on file  ?Transportation Needs: Not on file  ?Physical Activity: Not on file  ?Stress: Not on file  ?Social Connections: Not on file  ?Intimate Partner Violence: Not on file  ? ?Social History  ? ?Social History Narrative  ? Not on file  ? ? ? ?ROS: Negative.  ? ? ? ?PE: ?HEENT: Negative. ?Lungs: Clear. ?Cardio: RR. ? ? ? ?Assessment/Plan: ? ?Proceed with planned right breast abscess drainage.  ?Robert Bellow ?02/27/2022 ? [place]  ?

## 2022-02-27 NOTE — Transfer of Care (Signed)
Immediate Anesthesia Transfer of Care Note ? ?Patient: Betty Rodgers ? ?Procedure(s) Performed: INCISION AND DRAINAGE ABSCESS (Right: Breast) ? ?Patient Location: PACU ? ?Anesthesia Type:General ? ?Level of Consciousness: awake, drowsy and patient cooperative ? ?Airway & Oxygen Therapy: Patient Spontanous Breathing ? ?Post-op Assessment: Report given to RN and Post -op Vital signs reviewed and stable ? ?Post vital signs: Reviewed and stable ? ?Last Vitals:  ?Vitals Value Taken Time  ?BP 117/53 02/27/22 1730  ?Temp 36.1 ?C 02/27/22 1728  ?Pulse 104 02/27/22 1737  ?Resp 12 02/27/22 1737  ?SpO2 100 % 02/27/22 1737  ?Vitals shown include unvalidated device data. ? ?Last Pain:  ?Vitals:  ? 02/27/22 1728  ?TempSrc:   ?PainSc: Asleep  ?   ? ?  ? ?Complications: No notable events documented. ?

## 2022-02-27 NOTE — Anesthesia Preprocedure Evaluation (Addendum)
Anesthesia Evaluation  ?Patient identified by MRN, date of birth, ID band ?Patient awake ? ? ? ?Reviewed: ?Allergy & Precautions, NPO status , Patient's Chart, lab work & pertinent test results ? ?History of Anesthesia Complications ?Negative for: history of anesthetic complications ? ?Airway ?Mallampati: I ? ? ?Neck ROM: Full ? ? ? Dental ?no notable dental hx. ? ?  ?Pulmonary ?neg pulmonary ROS,  ?  ?Pulmonary exam normal ?breath sounds clear to auscultation ? ? ? ? ? ? Cardiovascular ?Exercise Tolerance: Good ?negative cardio ROS ?Normal cardiovascular exam ?Rhythm:Regular Rate:Normal ? ? ?  ?Neuro/Psych ?PSYCHIATRIC DISORDERS Anxiety negative neurological ROS ?   ? GI/Hepatic ?Crohn disease ?  ?Endo/Other  ?negative endocrine ROS ? Renal/GU ?negative Renal ROS  ? ?  ?Musculoskeletal ? ? Abdominal ?  ?Peds ? Hematology ?negative hematology ROS ?(+)   ?Anesthesia Other Findings ? ? Reproductive/Obstetrics ? ?  ? ? ? ? ? ? ? ? ? ? ? ? ? ?  ?  ? ? ? ? ? ? ? ?Anesthesia Physical ?Anesthesia Plan ? ?ASA: 2 ? ?Anesthesia Plan: General  ? ?Post-op Pain Management:   ? ?Induction: Intravenous ? ?PONV Risk Score and Plan: 3 and Ondansetron, Dexamethasone and Treatment may vary due to age or medical condition ? ?Airway Management Planned: LMA ? ?Additional Equipment:  ? ?Intra-op Plan:  ? ?Post-operative Plan: Extubation in OR ? ?Informed Consent: I have reviewed the patients History and Physical, chart, labs and discussed the procedure including the risks, benefits and alternatives for the proposed anesthesia with the patient or authorized representative who has indicated his/her understanding and acceptance.  ? ? ? ?Dental advisory given ? ?Plan Discussed with: CRNA ? ?Anesthesia Plan Comments: (Patient consented for risks of anesthesia including but not limited to:  ?- adverse reactions to medications ?- damage to eyes, teeth, lips or other oral mucosa ?- nerve damage due to positioning   ?- sore throat or hoarseness ?- damage to heart, brain, nerves, lungs, other parts of body or loss of life ? ?Informed patient about role of CRNA in peri- and intra-operative care.  Patient voiced understanding.)  ? ? ? ? ? ? ?Anesthesia Quick Evaluation ? ?

## 2022-02-27 NOTE — Op Note (Signed)
Preoperative diagnosis: Recurrent right breast abscess. ? ?Postoperative diagnosis: Same. ? ?Operative procedure: Operative drainage of the right breast with ultrasound guidance. ? ?Operating surgeon: Hervey Ard, MD. ? ?Anesthesia: General by LMA, Marcaine 0.5% with 1: 200,000's of epinephrine, 10 cc. ? ?Estimated blood loss: Less than 2 cc. ? ?Clinical note: This 46 year old woman was first seen last month with evidence of loculated abscess in the lateral aspect of the right breast just below the edge of the areola.  She responded well to aspiration.  Earlier this month she developed a contralateral inflammatory process and while she initially did well with aspiration she had recurrent discoloration of the overlying skin developed 24 hours after her last oral doxycycline.  She is brought to the operating for planned operative drainage. ? ?Operative note: The breast was cleansed with ChloraPrep and draped.  Ultrasound was used to identify the area of loculated purulence in the medial aspect of the breast underneath the edge of the areola at the 3 o'clock position.  At the very base of the abscess cavity local anesthesia was infiltrated about 3 cm below the nipple and a 1 cm incision made.  This was large enough to advance a Kelly clamp into the abscess cavity where scant fluid was noted.  A culture obtained of the cavity and a biopsy of the cavity was sent for routine histology.  Cavity was irrigated with saline and aspirated clear.  Palpation by finger showed no other loculations.  Ultrasound exam of the lateral aspect of the breast was unremarkable. ? ?1/4 inch Blake drain was anchored into the breast cavity with a transcutaneous suture 3-0 nylon anchored at the 12-1 o'clock position outside the edge of the areola.  A bulky dressing with fluff gauze was placed after Telfa pad and a breast binder applied. ? ?The patient tolerated the procedure well and was taken to the PACU in stable condition. ? ? ? ? ? ?

## 2022-02-27 NOTE — Discharge Instructions (Signed)
AMBULATORY SURGERY  ?DISCHARGE INSTRUCTIONS ? ? ?The drugs that you were given will stay in your system until tomorrow so for the next 24 hours you should not: ? ?Drive an automobile ?Make any legal decisions ?Drink any alcoholic beverage ? ? ?You may resume regular meals tomorrow.  Today it is better to start with liquids and gradually work up to solid foods. ? ?You may eat anything you prefer, but it is better to start with liquids, then soup and crackers, and gradually work up to solid foods. ? ? ?Please notify your doctor immediately if you have any unusual bleeding, trouble breathing, redness and pain at the surgery site, drainage, fever, or pain not relieved by medication. ? ? ? ?Additional Instructions: ? ? ? ?Please contact your physician with any problems or Same Day Surgery at [phone], Monday through Friday 6 am to 4 pm, or [facility] Main number at [phone].  ?

## 2022-02-27 NOTE — Anesthesia Procedure Notes (Signed)
Procedure Name: LMA Insertion ?Date/Time: 02/27/2022 4:46 PM ?Performed by: Jerrye Noble, CRNA ?Pre-anesthesia Checklist: Patient identified, Emergency Drugs available, Suction available and Patient being monitored ?Patient Re-evaluated:Patient Re-evaluated prior to induction ?Oxygen Delivery Method: Circle system utilized ?Preoxygenation: Pre-oxygenation with 100% oxygen ?Induction Type: IV induction ?Ventilation: Mask ventilation without difficulty ?LMA: LMA inserted ?LMA Size: 3.5 ?Number of attempts: 2 ?Placement Confirmation: positive ETCO2 and breath sounds checked- equal and bilateral ?Tube secured with: Tape ?Dental Injury: Teeth and Oropharynx as per pre-operative assessment  ? ? ? ? ?

## 2022-02-28 ENCOUNTER — Encounter: Payer: Self-pay | Admitting: General Surgery

## 2022-03-01 LAB — SURGICAL PATHOLOGY

## 2022-03-03 LAB — AEROBIC/ANAEROBIC CULTURE W GRAM STAIN (SURGICAL/DEEP WOUND): Gram Stain: NONE SEEN

## 2022-03-04 NOTE — Anesthesia Postprocedure Evaluation (Signed)
Anesthesia Post Note ? ?Patient: Betty Rodgers ? ?Procedure(s) Performed: INCISION AND DRAINAGE ABSCESS (Right: Breast) ? ?Patient location during evaluation: PACU ?Anesthesia Type: General ?Level of consciousness: awake and alert ?Pain management: pain level controlled ?Vital Signs Assessment: post-procedure vital signs reviewed and stable ?Respiratory status: spontaneous breathing, nonlabored ventilation and respiratory function stable ?Cardiovascular status: blood pressure returned to baseline and stable ?Postop Assessment: no apparent nausea or vomiting ?Anesthetic complications: no ? ? ?No notable events documented. ? ? ?Last Vitals:  ?Vitals:  ? 02/27/22 1812 02/27/22 1822  ?BP: 115/76 122/74  ?Pulse: 73 66  ?Resp: 18 18  ?Temp: 36.4 ?C (!) 36.3 ?C  ?SpO2: 100% 100%  ?  ?Last Pain:  ?Vitals:  ? 02/27/22 1822  ?TempSrc: Temporal  ?PainSc: 0-No pain  ? ? ?  ?  ?  ?  ?  ?  ? ?[place] Ouch ? ? ? ? ?

## 2022-03-21 ENCOUNTER — Ambulatory Visit
Admission: RE | Admit: 2022-03-21 | Discharge: 2022-03-21 | Disposition: A | Discharge status: Home / Self Care | Payer: BC Managed Care – PPO | Source: Ambulatory Visit | Attending: Emergency Medicine | Admitting: Emergency Medicine

## 2022-03-21 VITALS — BP 131/81 | HR 77 | Temp 98.5°F | Resp 16

## 2022-03-21 DIAGNOSIS — R21 Rash and other nonspecific skin eruption: Secondary | ICD-10-CM | POA: Diagnosis not present

## 2022-03-21 MED ORDER — CEPHALEXIN 500 MG PO CAPS: 500.0000 mg | ORAL_CAPSULE | Freq: Two times a day (BID) | ORAL | 0 refills | Status: AC

## 2022-03-21 MED ORDER — PREDNISONE 10 MG (21) PO TBPK: ORAL_TABLET | Freq: Every day | ORAL | 0 refills | Status: DC

## 2022-03-21 NOTE — ED Triage Notes (Signed)
Pt presents with a rash on her face for several weeks.

## 2022-03-21 NOTE — Discharge Instructions (Signed)
your rash is unknown at this time therefore we will move forward with treatment for inflammatory and bacterial processes which are typically the cause of this  I have a low suspicion that your rash is related to your recent antibiotic use as typically medication reactions occur 1 to 2 days after use and is more widespread  Begin use of prednisone every morning with food as directed on packaging, this medicine will stop the inflammatory process associated with most rashes and allow it to begin to clear  If no improvement seen in your symptoms by Monday you may begin use of Keflex taking twice daily for the next 5 days  Avoid prolonged exposure to heat over the affected areas as this may cause further irritation  If after completion of both medications your rash has persisted or any point your symptoms begin to worsen you may follow-up with urgent care as needed for reevaluation and management

## 2022-03-21 NOTE — ED Provider Notes (Signed)
[facility]    CSN: [id number] Arrival date & time: 03/21/22  1500      History   Chief Complaint Chief Complaint  Patient presents with   Rash    Entered by patient    HPI Betty Rodgers is a 46 y.o. female.   Presents with rash to the face for 2 weeks.  Initially was 1 erythematous lesion to the right side of face that started the day after she was mowing the lawn.  Yesterday she noticed 2 new lesions on to the face and sought out treatment.  Has attempted use of topical Benadryl cream which has been ineffective.  Rash is not pruritic, not painful or draining.  Denies fevers or chills.  No known exposure.  Denies changes in soaps, lotions, detergents, dietary changes or recent travels.  Endorses that she recently was on 2 courses of antibiotics due to an abscess.  Past Medical History:  Diagnosis Date   Acute abdominal pain in right lower quadrant 03/25/2016   Anxiety disorder 09/27/2015   Anxiety state, unspecified    Chronic lymphocytic thyroiditis    H/O: hysterectomy    History of Crohn's disease    Irritable bowel syndrome    Other chest pain    Other specified congenital anomaly of skin    Sciatica    Unspecified chronic bronchitis ([facility])    Unspecified sinusitis (chronic)    Vaginitis and vulvovaginitis, unspecified     Patient Active Problem List   Diagnosis Date Noted   Subareolar mass of right breast 01/14/2022   Carpal tunnel syndrome 07/24/2021   Candidal vaginitis 09/18/2020   Diarrhea 02/11/2018   Dizziness 02/11/2018   Acute cystitis without hematuria 02/11/2018   Family history of diabetes mellitus in father 10/29/2016   Well woman exam without gynecological exam 10/29/2016   Fibroid uterus 08/20/2016   Abnormal findings-gastrointestinal tract    Other specified diseases of intestine    Benign neoplasm of transverse colon    Rectal polyp    Acute abdominal pain in right lower quadrant 03/25/2016   Anxiety disorder 09/27/2015     Past Surgical History:  Procedure Laterality Date   ABDOMINAL HYSTERECTOMY     COLONOSCOPY WITH PROPOFOL N/A 05/09/2016   Procedure: COLONOSCOPY WITH PROPOFOL;  Surgeon: Lucilla Lame, MD;  Location: [place];  Service: Endoscopy;  Laterality: N/A;  PLEASE LEAVE EARLY AS POSSIBLE   CYSTOSCOPY N/A 08/20/2016   Procedure: CYSTOSCOPY;  Surgeon: Gae Dry, MD;  Location: [facility];  Service: Gynecology;  Laterality: N/A;   INCISION AND DRAINAGE ABSCESS Right 02/27/2022   Procedure: INCISION AND DRAINAGE ABSCESS;  Surgeon: Robert Bellow, MD;  Location: [facility];  Service: General;  Laterality: Right;   LAPAROSCOPIC BILATERAL SALPINGECTOMY Bilateral 08/20/2016   Procedure: LAPAROSCOPIC BILATERAL SALPINGECTOMY;  Surgeon: Gae Dry, MD;  Location: [facility];  Service: Gynecology;  Laterality: Bilateral;   LAPAROSCOPIC HYSTERECTOMY N/A 08/20/2016   Procedure: HYSTERECTOMY TOTAL LAPAROSCOPIC;  Surgeon: Gae Dry, MD;  Location: [facility];  Service: Gynecology;  Laterality: N/A;   POLYPECTOMY  05/09/2016   Procedure: POLYPECTOMY;  Surgeon: Lucilla Lame, MD;  Location: [place];  Service: Endoscopy;;   WISDOM TOOTH EXTRACTION      OB History     Gravida  0   Para  0   Term  0   Preterm  0   AB  0   Living  0      SAB  0   IAB  0   Ectopic  0   Multiple  0   Live Births  0            Home Medications    Prior to Admission medications   Medication Sig Start Date End Date Taking? Authorizing Provider  cephALEXin (KEFLEX) 500 MG capsule Take 1 capsule (500 mg total) by mouth 2 (two) times daily for 5 days. 03/21/22 03/26/22 Yes Alee Gressman R, NP  predniSONE (STERAPRED UNI-PAK 21 TAB) 10 MG (21) TBPK tablet Take by mouth daily. Take 6 tabs by mouth daily  for 2 days, then 5 tabs for 2 days, then 4 tabs for 2 days, then 3 tabs for 2 days, 2 tabs for 2 days, then 1 tab by mouth daily for 2 days 03/21/22  Yes Kathee Tumlin R, NP  Ascorbic  Acid (VITAMIN C PO) Take 1 tablet by mouth every evening.    [provider]  Cholecalciferol (VITAMIN D PO) Take 1 tablet by mouth every evening.    [provider]  Cyanocobalamin (VITAMIN B-12 PO) Take 1 tablet by mouth every evening.    [provider]  ELDERBERRY PO Take 1 capsule by mouth every evening.    [provider]  HYDROcodone-acetaminophen (NORCO/VICODIN) 5-325 MG tablet Take 1 tablet by mouth every 4 (four) hours as needed for moderate pain. 02/27/22 02/27/23  Robert Bellow, MD  ibuprofen (ADVIL) 200 MG tablet Take 400 mg by mouth every 8 (eight) hours as needed (for pain.).    [provider]  Na Sulfate-K Sulfate-Mg Sulf (SUPREP BOWEL PREP KIT) 17.5-3.13-1.6 GM/177ML SOLN Take 1 kit by mouth as directed. Patient not taking: Reported on 01/14/2022 07/24/21   Lucilla Lame, MD  Omega-3 Fatty Acids (OMEGA 3 PO) Take 1 capsule by mouth every evening.    [provider]  Probiotic Product (PROBIOTIC BLEND PO) Take 1 capsule by mouth every evening.    [provider]  sulfamethoxazole-trimethoprim (BACTRIM DS) 800-160 MG tablet Take 1 tablet by mouth in the morning and at bedtime. 01/16/22   [provider]    Family History Family History  Problem Relation Age of Onset   Uterine cancer Mother        83s   Cancer Father        started in colon and has spreaded   Hypertension Father    Heart disease Father    Diabetes Father        Controlled   Heart attack Paternal Grandmother    Breast cancer Neg Hx     Social History Social History   Tobacco Use   Smoking status: Never   Smokeless tobacco: Never  Vaping Use   Vaping Use: Never used  Substance Use Topics   Alcohol use: Not Currently   Drug use: No     Allergies   Bee venom   Review of Systems Review of Systems  Constitutional: Negative.   Respiratory: Negative.    Cardiovascular: Negative.   Skin:  Positive for rash. Negative for  color change, pallor and wound.  Neurological: Negative.     Physical Exam Triage Vital Signs ED Triage Vitals  Enc Vitals Group     BP 03/21/22 1519 131/81     Pulse Rate 03/21/22 1519 77     Resp 03/21/22 1519 16     Temp 03/21/22 1519 98.5 F (36.9 C)     Temp Source 03/21/22 1519 Oral  SpO2 03/21/22 1519 100 %     Weight --      Height --      Head Circumference --      Peak Flow --      Pain Score 03/21/22 1518 0     Pain Loc --      Pain Edu? --      Excl. in [place]? --    No data found.  Updated Vital Signs BP 131/81 (BP Location: Left Arm)   Pulse 77   Temp 98.5 F (36.9 C) (Oral)   Resp 16   LMP 08/16/2016   SpO2 100%   Visual Acuity Right Eye Distance:   Left Eye Distance:   Bilateral Distance:    Right Eye Near:   Left Eye Near:    Bilateral Near:     Physical Exam Constitutional:      Appearance: Normal appearance.  HENT:     Head: Normocephalic.  Eyes:     Extraocular Movements: Extraocular movements intact.  Pulmonary:     Effort: Pulmonary effort is normal.  Skin:    Comments: 3 erythematous macules with dry flaking skin present to the right and left cheek, scant yellow crusting noted to the macule present on the left cheek over scar  Neurological:     Mental Status: She is alert and oriented to person, place, and time. Mental status is at baseline.  Psychiatric:        Mood and Affect: Mood normal.        Behavior: Behavior normal.          UC Treatments / Results  Labs (all labs ordered are listed, but only abnormal results are displayed) Labs Reviewed - No data to display  EKG   Radiology No results found.  Procedures Procedures (including critical care time)  Medications Ordered in UC Medications - No data to display  Initial Impression / Assessment and Plan / UC Course  I have reviewed the triage vital signs and the nursing notes.  Pertinent labs & imaging results that were available during my care of the  patient were reviewed by me and considered in my medical decision making (see chart for details).  Rash and nonspecific skin eruption  Unknown etiology, discussed with patient, low suspicion of a medication reaction as patient has already completed medication with no complication, prednisone 60 mg taper prescribed as well as Keflex 5-day course, discussed administration, patient would like to complete a watchful waiting for use of antibiotics, if no improvement seen within 5-day use of steroid she will begin antibiotic, recommended avoidance of over exposure to heat to prevent further irritation, given strict precautions for persisting or worsening rash to follow-up with urgent care as needed Final Clinical Impressions(s) / UC Diagnoses   Final diagnoses:  Rash and nonspecific skin eruption     Discharge Instructions      your rash is unknown at this time therefore we will move forward with treatment for inflammatory and bacterial processes which are typically the cause of this  I have a low suspicion that your rash is related to your recent antibiotic use as typically medication reactions occur 1 to 2 days after use and is more widespread  Begin use of prednisone every morning with food as directed on packaging, this medicine will stop the inflammatory process associated with most rashes and allow it to begin to clear  If no improvement seen in your symptoms by Monday you may begin use of Keflex  taking twice daily for the next 5 days  Avoid prolonged exposure to heat over the affected areas as this may cause further irritation  If after completion of both medications your rash has persisted or any point your symptoms begin to worsen you may follow-up with urgent care as needed for reevaluation and management   ED Prescriptions     Medication Sig Dispense Auth. Provider   predniSONE (STERAPRED UNI-PAK 21 TAB) 10 MG (21) TBPK tablet Take by mouth daily. Take 6 tabs by mouth daily  for 2  days, then 5 tabs for 2 days, then 4 tabs for 2 days, then 3 tabs for 2 days, 2 tabs for 2 days, then 1 tab by mouth daily for 2 days 42 tablet Quinita Kostelecky R, NP   cephALEXin (KEFLEX) 500 MG capsule Take 1 capsule (500 mg total) by mouth 2 (two) times daily for 5 days. 10 capsule Hans Eden, NP      PDMP not reviewed this encounter.   Hans Eden, NP 03/21/22 1545

## 2022-03-29 ENCOUNTER — Encounter: Payer: Self-pay | Admitting: Obstetrics and Gynecology

## 2022-03-31 ENCOUNTER — Other Ambulatory Visit: Payer: Self-pay | Admitting: Obstetrics and Gynecology

## 2022-03-31 DIAGNOSIS — N898 Other specified noninflammatory disorders of vagina: Secondary | ICD-10-CM

## 2022-03-31 MED ORDER — FLUCONAZOLE 150 MG PO TABS
150.0000 mg | ORAL_TABLET | Freq: Once | ORAL | 2 refills | Status: AC
Start: 2022-03-31 — End: 2022-03-31

## 2022-03-31 NOTE — Progress Notes (Signed)
Rx RF diflucan for yeast vag sx

## 2022-04-24 ENCOUNTER — Encounter: Payer: Self-pay | Admitting: Gastroenterology

## 2022-05-03 ENCOUNTER — Encounter: Payer: Self-pay | Admitting: Gastroenterology

## 2022-05-03 ENCOUNTER — Encounter
Admission: RE | Disposition: A | Discharge status: Home / Self Care | Payer: Self-pay | Source: Home / Self Care | Attending: Gastroenterology

## 2022-05-03 ENCOUNTER — Other Ambulatory Visit: Payer: Self-pay

## 2022-05-03 ENCOUNTER — Ambulatory Visit
Admission: RE | Admit: 2022-05-03 | Discharge: 2022-05-03 | Disposition: A | Discharge status: Home / Self Care | Payer: BC Managed Care – PPO | Attending: Gastroenterology | Admitting: Gastroenterology

## 2022-05-03 ENCOUNTER — Ambulatory Visit: Payer: BC Managed Care – PPO | Admitting: Anesthesiology

## 2022-05-03 DIAGNOSIS — D125 Benign neoplasm of sigmoid colon: Secondary | ICD-10-CM | POA: Insufficient documentation

## 2022-05-03 DIAGNOSIS — Z1211 Encounter for screening for malignant neoplasm of colon: Secondary | ICD-10-CM | POA: Diagnosis not present

## 2022-05-03 DIAGNOSIS — K635 Polyp of colon: Secondary | ICD-10-CM | POA: Diagnosis not present

## 2022-05-03 DIAGNOSIS — Z860100 Personal history of colon polyps, unspecified: Secondary | ICD-10-CM

## 2022-05-03 HISTORY — PX: COLONOSCOPY WITH PROPOFOL: SHX5780

## 2022-05-03 HISTORY — PX: POLYPECTOMY: SHX5525

## 2022-05-03 SURGERY — COLONOSCOPY WITH PROPOFOL
Anesthesia: General | Site: Rectum

## 2022-05-03 MED ORDER — SODIUM CHLORIDE 0.9 % IV SOLN: INTRAVENOUS | Status: DC

## 2022-05-03 MED ORDER — OXYCODONE HCL 5 MG/5ML PO SOLN: 5.0000 mg | Freq: Once | ORAL | Status: DC | PRN

## 2022-05-03 MED ORDER — PROPOFOL 10 MG/ML IV BOLUS
INTRAVENOUS | Status: DC | PRN
  Administered 2022-05-03 (×2): 30 mg via INTRAVENOUS
  Administered 2022-05-03: 120 mg via INTRAVENOUS
  Administered 2022-05-03: 30 mg via INTRAVENOUS
  Administered 2022-05-03 (×3): 40 mg via INTRAVENOUS

## 2022-05-03 MED ORDER — OXYCODONE HCL 5 MG PO TABS: 5.0000 mg | ORAL_TABLET | Freq: Once | ORAL | Status: DC | PRN

## 2022-05-03 MED ORDER — STERILE WATER FOR IRRIGATION IR SOLN
Status: DC | PRN
  Administered 2022-05-03: 100 mL

## 2022-05-03 MED ORDER — LACTATED RINGERS IV SOLN: INTRAVENOUS | Status: DC

## 2022-05-03 MED ORDER — LIDOCAINE HCL (CARDIAC) PF 100 MG/5ML IV SOSY
PREFILLED_SYRINGE | INTRAVENOUS | Status: DC | PRN
  Administered 2022-05-03: 30 mg via INTRAVENOUS

## 2022-05-03 SURGICAL SUPPLY — 8 items
GOWN CVR UNV OPN BCK APRN NK (MISCELLANEOUS) ×2 IMPLANT
GOWN ISOL THUMB LOOP REG UNIV (MISCELLANEOUS) ×4
KIT PRC NS LF DISP ENDO (KITS) ×1 IMPLANT
KIT PROCEDURE OLYMPUS (KITS) ×2
MANIFOLD NEPTUNE II (INSTRUMENTS) ×2 IMPLANT
SNARE COLD EXACTO (MISCELLANEOUS) ×1 IMPLANT
TRAP ETRAP POLY (MISCELLANEOUS) ×1 IMPLANT
WATER STERILE IRR 250ML POUR (IV SOLUTION) ×2 IMPLANT

## 2022-05-03 NOTE — Transfer of Care (Signed)
Immediate Anesthesia Transfer of Care Note  Patient: Betty Rodgers  Procedure(s) Performed: COLONOSCOPY WITH BIOPSY (Rectum) POLYPECTOMY (Rectum)  Patient Location: PACU  Anesthesia Type: General  Level of Consciousness: awake, alert  and patient cooperative  Airway and Oxygen Therapy: Patient Spontanous Breathing and Patient connected to supplemental oxygen  Post-op Assessment: Post-op Vital signs reviewed, Patient's Cardiovascular Status Stable, Respiratory Function Stable, Patent Airway and No signs of Nausea or vomiting  Post-op Vital Signs: Reviewed and stable  Complications: No notable events documented.

## 2022-05-03 NOTE — Anesthesia Procedure Notes (Signed)
Date/Time: 05/03/2022 9:13 AM  Performed by: Cameron Ali, CRNAPre-anesthesia Checklist: Patient identified, Emergency Drugs available, Suction available, Timeout performed and Patient being monitored Patient Re-evaluated:Patient Re-evaluated prior to induction Oxygen Delivery Method: Nasal cannula Placement Confirmation: positive ETCO2

## 2022-05-03 NOTE — Op Note (Signed)
[facility] Patient Name: Betty Rodgers Procedure Date: 05/03/2022 9:03 AM MRN: [id number] Account #: [id number] Date of Birth: 10/15/1976 Admit Type: Outpatient Age: 46 Room: [facility] OR ROOM 01 Gender: Female Note Status: Finalized Instrument Name: [id number] Procedure:             Colonoscopy Indications:           High risk colon cancer surveillance: Personal history                         of colonic polyps Providers:             Lucilla Lame MD, MD Medicines:             Propofol per Anesthesia Complications:         No immediate complications. Procedure:             Pre-Anesthesia Assessment:                        - Prior to the procedure, a History and Physical was                         performed, and patient medications and allergies were                         reviewed. The patient's tolerance of previous                         anesthesia was also reviewed. The risks and benefits                         of the procedure and the sedation options and risks                         were discussed with the patient. All questions were                         answered, and informed consent was obtained. Prior                         Anticoagulants: The patient has taken no previous                         anticoagulant or antiplatelet agents. ASA Grade                         Assessment: II - A patient with mild systemic disease.                         After reviewing the risks and benefits, the patient                         was deemed in satisfactory condition to undergo the                         procedure.                        After obtaining informed consent, the colonoscope was  passed under direct vision. Throughout the procedure,                         the patient's blood pressure, pulse, and oxygen                         saturations were monitored continuously. The was                         introduced  through the anus and advanced to the the                         cecum, identified by appendiceal orifice and ileocecal                         valve. The colonoscopy was performed without                         difficulty. The patient tolerated the procedure well.                         The quality of the bowel preparation was excellent. Findings:      The perianal and digital rectal examinations were normal.      Three sessile polyps were found in the sigmoid colon. The polyps were 2       to 3 mm in size. These polyps were removed with a cold snare. Resection       and retrieval were complete.      Multiple small-mouthed diverticula were found in the sigmoid colon.      Non-bleeding internal hemorrhoids were found during retroflexion. The       hemorrhoids were Grade I (internal hemorrhoids that do not prolapse). Impression:            - Three 2 to 3 mm polyps in the sigmoid colon, removed                         with a cold snare. Resected and retrieved.                        - Diverticulosis in the sigmoid colon.                        - Non-bleeding internal hemorrhoids. Recommendation:        - Discharge patient to home.                        - Resume previous diet.                        - Continue present medications.                        - Await pathology results.                        - Repeat colonoscopy in 7 years for surveillance. Procedure Code(s):     --- Professional ---                        [phone],  Colonoscopy, flexible; with removal of                         tumor(s), polyp(s), or other lesion(s) by snare                         technique Diagnosis Code(s):     --- Professional ---                        Z86.010, Personal history of colonic polyps                        K63.5, Polyp of colon CPT copyright 2019 American Medical Association. All rights reserved. The codes documented in this report are preliminary and upon coder review may  be revised to meet  current compliance requirements. Lucilla Lame MD, MD 05/03/2022 9:35:59 AM This report has been signed electronically. Number of Addenda: 0 Note Initiated On: 05/03/2022 9:03 AM Scope Withdrawal Time: 0 hours 9 minutes 44 seconds  Total Procedure Duration: 0 hours 14 minutes 56 seconds  Estimated Blood Loss:  Estimated blood loss: none.      [facility]

## 2022-05-03 NOTE — Anesthesia Postprocedure Evaluation (Signed)
Anesthesia Post Note  Patient: Betty Rodgers  Procedure(s) Performed: COLONOSCOPY WITH BIOPSY (Rectum) POLYPECTOMY (Rectum)     Patient location during evaluation: PACU Anesthesia Type: General Level of consciousness: awake and alert Pain management: pain level controlled Vital Signs Assessment: post-procedure vital signs reviewed and stable Respiratory status: spontaneous breathing, nonlabored ventilation, respiratory function stable and patient connected to nasal cannula oxygen Cardiovascular status: blood pressure returned to baseline and stable Postop Assessment: no apparent nausea or vomiting Anesthetic complications: no   No notable events documented.  Fidel Levy

## 2022-05-03 NOTE — Anesthesia Preprocedure Evaluation (Signed)
Anesthesia Evaluation  Patient identified by MRN, date of birth, ID band Patient awake    Reviewed: NPO status   History of Anesthesia Complications Negative for: history of anesthetic complications  Airway Mallampati: II  TM Distance: >3 FB Neck ROM: full    Dental no notable dental hx.    Pulmonary neg pulmonary ROS,    Pulmonary exam normal        Cardiovascular Exercise Tolerance: Good negative cardio ROS Normal cardiovascular exam     Neuro/Psych Anxiety negative neurological ROS     GI/Hepatic Neg liver ROS, IBS vs Crohns   Endo/Other  negative endocrine ROS  Renal/GU negative Renal ROS  negative genitourinary   Musculoskeletal   Abdominal   Peds  Hematology negative hematology ROS (+)   Anesthesia Other Findings   Reproductive/Obstetrics negative OB ROS TAH                             Anesthesia Physical Anesthesia Plan  ASA: 2  Anesthesia Plan: General   Post-op Pain Management:    Induction:   PONV Risk Score and Plan: 3 and Propofol infusion, TIVA and Treatment may vary due to age or medical condition  Airway Management Planned:   Additional Equipment:   Intra-op Plan:   Post-operative Plan:   Informed Consent: I have reviewed the patients History and Physical, chart, labs and discussed the procedure including the risks, benefits and alternatives for the proposed anesthesia with the patient or authorized representative who has indicated his/her understanding and acceptance.       Plan Discussed with: CRNA  Anesthesia Plan Comments:         Anesthesia Quick Evaluation

## 2022-05-03 NOTE — H&P (Signed)
Lucilla Lame, MD [address] [place], [place] [address] Phone:[phone] Fax : [phone]  Primary Care Physician:  Patient, No Pcp Per Primary Gastroenterologist:  Dr. Allen Norris  Pre-Procedure History & Physical: HPI:  Betty Rodgers is a 46 y.o. female is here for an colonoscopy.   Past Medical History:  Diagnosis Date   Acute abdominal pain in right lower quadrant 03/25/2016   Anxiety disorder 09/27/2015   Anxiety state, unspecified    Chronic lymphocytic thyroiditis    H/O: hysterectomy    History of Crohn's disease    Irritable bowel syndrome    Other chest pain    Other specified congenital anomaly of skin    Sciatica    Unspecified chronic bronchitis (HCC)    Unspecified sinusitis (chronic)    Vaginitis and vulvovaginitis, unspecified     Past Surgical History:  Procedure Laterality Date   ABDOMINAL HYSTERECTOMY     COLONOSCOPY WITH PROPOFOL N/A 05/09/2016   Procedure: COLONOSCOPY WITH PROPOFOL;  Surgeon: Lucilla Lame, MD;  Location: [facility];  Service: Endoscopy;  Laterality: N/A;  PLEASE LEAVE EARLY AS POSSIBLE   CYSTOSCOPY N/A 08/20/2016   Procedure: CYSTOSCOPY;  Surgeon: Gae Dry, MD;  Location: [facility];  Service: Gynecology;  Laterality: N/A;   INCISION AND DRAINAGE ABSCESS Right 02/27/2022   Procedure: INCISION AND DRAINAGE ABSCESS;  Surgeon: Robert Bellow, MD;  Location: [facility];  Service: General;  Laterality: Right;   LAPAROSCOPIC BILATERAL SALPINGECTOMY Bilateral 08/20/2016   Procedure: LAPAROSCOPIC BILATERAL SALPINGECTOMY;  Surgeon: Gae Dry, MD;  Location: [facility];  Service: Gynecology;  Laterality: Bilateral;   LAPAROSCOPIC HYSTERECTOMY N/A 08/20/2016   Procedure: HYSTERECTOMY TOTAL LAPAROSCOPIC;  Surgeon: Gae Dry, MD;  Location: [facility];  Service: Gynecology;  Laterality: N/A;   POLYPECTOMY  05/09/2016   Procedure: POLYPECTOMY;  Surgeon: Lucilla Lame, MD;  Location: [place];  Service:  Endoscopy;;   WISDOM TOOTH EXTRACTION      Prior to Admission medications   Medication Sig Start Date End Date Taking? Authorizing Provider  Ascorbic Acid (VITAMIN C PO) Take 1 tablet by mouth every evening.   Yes [provider]  Cholecalciferol (VITAMIN D PO) Take 1 tablet by mouth every evening.   Yes [provider]  Cyanocobalamin (VITAMIN B-12 PO) Take 1 tablet by mouth every evening.   Yes [provider]  ELDERBERRY PO Take 1 capsule by mouth every evening.   Yes [provider]  ibuprofen (ADVIL) 200 MG tablet Take 400 mg by mouth every 8 (eight) hours as needed (for pain.).   Yes [provider]  magnesium oxide (MAG-OX) 400 (240 Mg) MG tablet Take 400 mg by mouth daily.   Yes [provider]  Na Sulfate-K Sulfate-Mg Sulf (SUPREP BOWEL PREP KIT) 17.5-3.13-1.6 GM/177ML SOLN Take 1 kit by mouth as directed. 07/24/21  Yes Lucilla Lame, MD  Omega-3 Fatty Acids (OMEGA 3 PO) Take 1 capsule by mouth every evening.   Yes [provider]  Probiotic Product (PROBIOTIC BLEND PO) Take 1 capsule by mouth every evening.   Yes [provider]    Allergies as of 12/31/2021 - Review Complete 12/31/2021  Allergen Reaction Noted   Bee venom  01/13/2013    Family History  Problem Relation Age of Onset   Uterine cancer Mother        25s   Cancer Father        started in colon and has spreaded  Hypertension Father    Heart disease Father    Diabetes Father        Controlled   Heart attack Paternal Grandmother    Breast cancer Neg Hx     Social History   Socioeconomic History   Marital status: Single    Spouse name: Not on file   Number of children: Not on file   Years of education: Not on file   Highest education level: Not on file  Occupational History   Not on file  Tobacco Use   Smoking status: Never   Smokeless tobacco: Never  Vaping Use   Vaping Use: Never used  Substance and Sexual Activity   Alcohol  use: Not Currently   Drug use: No   Sexual activity: Yes    Partners: Male    Birth control/protection: Surgical    Comment: Hysterectomy  Other Topics Concern   Not on file  Social History Narrative   Not on file   Social Determinants of Health   Financial Resource Strain: Not on file  Food Insecurity: Not on file  Transportation Needs: Not on file  Physical Activity: Insufficiently Active (09/08/2019)   Exercise Vital Sign    Days of Exercise per Week: 1 day    Minutes of Exercise per Session: 60 min  Stress: Stress Concern Present (09/08/2019)   [place]    Feeling of Stress : To some extent  Social Connections: Somewhat Isolated (09/08/2019)   Social Connection and Isolation Panel [NHANES]    Frequency of Communication with Friends and Family: More than three times a week    Frequency of Social Gatherings with Friends and Family: Three times a week    Attends Religious Services: Never    Active Member of Clubs or Organizations: No    Attends Archivist Meetings: Never    Marital Status: Married  Human resources officer Violence: Not At Risk (09/08/2019)   Humiliation, Afraid, Rape, and Kick questionnaire    Fear of Current or Ex-Partner: No    Emotionally Abused: No    Physically Abused: No    Sexually Abused: No    Review of Systems: See HPI, otherwise negative ROS  Physical Exam: BP (!) 110/91   Pulse 84   Temp 98.2 F (36.8 C)   Resp 20   Ht _0  (1.626 m)   Wt 61.2 kg   LMP 08/16/2016   SpO2 98%   BMI 23.17 kg/m  General:   Alert,  pleasant and cooperative in NAD Head:  Normocephalic and atraumatic. Neck:  Supple; no masses or thyromegaly. Lungs:  Clear throughout to auscultation.    Heart:  Regular rate and rhythm. Abdomen:  Soft, nontender and nondistended. Normal bowel sounds, without guarding, and without rebound.   Neurologic:  Alert and  oriented x4;  grossly normal  neurologically.  Impression/Plan: Betty Rodgers is here for an colonoscopy to be performed for a history of adenomatous polyps on 2017   Risks, benefits, limitations, and alternatives regarding  colonoscopy have been reviewed with the patient.  Questions have been answered.  All parties agreeable.   Lucilla Lame, MD  05/03/2022, 8:39 AM

## 2022-05-06 ENCOUNTER — Encounter: Payer: Self-pay | Admitting: Gastroenterology

## 2022-05-06 LAB — SURGICAL PATHOLOGY

## 2022-05-07 ENCOUNTER — Encounter: Payer: Self-pay | Admitting: Gastroenterology

## 2022-05-30 ENCOUNTER — Encounter: Payer: Self-pay | Admitting: Obstetrics and Gynecology

## 2022-09-13 ENCOUNTER — Encounter: Payer: Self-pay | Admitting: Obstetrics and Gynecology

## 2022-09-23 ENCOUNTER — Encounter: Payer: Self-pay | Admitting: Obstetrics and Gynecology

## 2022-09-23 DIAGNOSIS — B3731 Acute candidiasis of vulva and vagina: Secondary | ICD-10-CM

## 2022-09-23 MED ORDER — FLUCONAZOLE 150 MG PO TABS: 150.0000 mg | ORAL_TABLET | Freq: Once | ORAL | 0 refills | Status: AC

## 2022-09-23 NOTE — Telephone Encounter (Signed)
Patient is scheduled for 10/31/22 with ABC

## 2022-09-23 NOTE — Telephone Encounter (Signed)
Pls help pt schedule her annual.

## 2022-10-31 ENCOUNTER — Ambulatory Visit: Payer: BC Managed Care – PPO | Admitting: Obstetrics and Gynecology

## 2022-11-20 NOTE — Progress Notes (Signed)
PCP:  Patient, No Pcp Per   Chief Complaint  Patient presents with   Gynecologic Exam    No concerns     HPI:      [name] is a 47 y.o. G0P0000 who LMP was Patient's last menstrual period was 08/16/2016., presents today for her annual examination.  Her menses are absent due to lap hyst/salpingectomy for menorrhagia and leio 2017 with Dr. Kenton Kingfisher.  She does not have PMB. Occas vasomotor sx. Gets occas BLQ twinges.   Sex activity: single partner, contraception - status post hysterectomy. No pain/bleeding.  Last Pap: 09/08/19  Results were: no abnormalities /neg HPV DNA. Hx of STDs: none  Hx of recurrent yeast vag past couple of yrs. Pt using dove sens skin soap, no dryer sheets. Wears thongs regularly and doesn't plan to stop. :-) I send diflucan RF in prn sx.  Pt needs RF  Last mammogram: 12/31/21  Results were: normal, repeat in 12 months; hx of cat 3 RT breast, normal repeat imaging 04/17/21. Pt with breast abscess 5/23 after mammo, requiring surgery with Dr. Bary Castilla. Pt concerned mammo caused it and is hesitant to get another one right now.  There is no FH of breast cancer. There is no FH of ovarian cancer. Her biological mom died last yr from uterine cancer in her 20s. The patient does self-breast exams.  Tobacco use: The patient denies current or previous tobacco use. Alcohol use: none No drug use.  Exercise: not active  Colonoscopy: 6/23 with Dr. Allen Norris with polyps; 2017 with polyps; repeat due after 7 yrs now. Dad with hx of stage 4 colon cancer dx  She does get adequate calcium and Vitamin D in her diet.  Normal fasting labs 11/21 and 11/22 (recent labs including thyroid panel, lipids, CMP, HgA1C)   Past Medical History:  Diagnosis Date   Acute abdominal pain in right lower quadrant 03/25/2016   Anxiety disorder 09/27/2015   Anxiety state, unspecified    Chronic lymphocytic thyroiditis    H/O: hysterectomy    History of Crohn's disease    Irritable bowel  syndrome    Other chest pain    Other specified congenital anomaly of skin    Sciatica    Unspecified chronic bronchitis (HCC)    Unspecified sinusitis (chronic)    Vaginitis and vulvovaginitis, unspecified     Past Surgical History:  Procedure Laterality Date   ABDOMINAL HYSTERECTOMY     COLONOSCOPY WITH PROPOFOL N/A 05/09/2016   Procedure: COLONOSCOPY WITH PROPOFOL;  Surgeon: Lucilla Lame, MD;  Location: [place];  Service: Endoscopy;  Laterality: N/A;  PLEASE LEAVE EARLY AS POSSIBLE   COLONOSCOPY WITH PROPOFOL N/A 05/03/2022   Procedure: COLONOSCOPY WITH BIOPSY;  Surgeon: Lucilla Lame, MD;  Location: [place] [facility] [place];  Service: Endoscopy;  Laterality: N/A;   CYSTOSCOPY N/A 08/20/2016   Procedure: CYSTOSCOPY;  Surgeon: Gae Dry, MD;  Location: [facility];  Service: Gynecology;  Laterality: N/A;   INCISION AND DRAINAGE ABSCESS Right 02/27/2022   Procedure: INCISION AND DRAINAGE ABSCESS;  Surgeon: Robert Bellow, MD;  Location: [facility];  Service: General;  Laterality: Right;   LAPAROSCOPIC BILATERAL SALPINGECTOMY Bilateral 08/20/2016   Procedure: LAPAROSCOPIC BILATERAL SALPINGECTOMY;  Surgeon: Gae Dry, MD;  Location: [facility];  Service: Gynecology;  Laterality: Bilateral;   LAPAROSCOPIC HYSTERECTOMY N/A 08/20/2016   Procedure: HYSTERECTOMY TOTAL LAPAROSCOPIC;  Surgeon: Gae Dry, MD;  Location: [facility];  Service: Gynecology;  Laterality: N/A;   POLYPECTOMY  05/09/2016   Procedure: POLYPECTOMY;  Surgeon: Lucilla Lame, MD;  Location: [place];  Service: Endoscopy;;   POLYPECTOMY N/A 05/03/2022   Procedure: POLYPECTOMY;  Surgeon: Lucilla Lame, MD;  Location: [place];  Service: Endoscopy;  Laterality: N/A;   WISDOM TOOTH EXTRACTION      Family History  Problem Relation Age of Onset   Uterine cancer Mother        21s   Cancer Father        started in colon and has spreaded   Hypertension Father    Heart disease Father    Diabetes  Father        Controlled   Heart attack Paternal Grandmother    Breast cancer Neg Hx     Social History   Socioeconomic History   Marital status: Single    Spouse name: Not on file   Number of children: Not on file   Years of education: Not on file   Highest education level: Not on file  Occupational History   Not on file  Tobacco Use   Smoking status: Never   Smokeless tobacco: Never  Vaping Use   Vaping Use: Never used  Substance and Sexual Activity   Alcohol use: Not Currently   Drug use: No   Sexual activity: Yes    Partners: Male    Birth control/protection: Surgical    Comment: Hysterectomy  Other Topics Concern   Not on file  Social History Narrative   Not on file   Social Determinants of Health   Financial Resource Strain: Not on file  Food Insecurity: Not on file  Transportation Needs: Not on file  Physical Activity: Insufficiently Active (09/08/2019)   Exercise Vital Sign    Days of Exercise per Week: 1 day    Minutes of Exercise per Session: 60 min  Stress: Stress Concern Present (09/08/2019)   [place]    Feeling of Stress : To some extent  Social Connections: Somewhat Isolated (09/08/2019)   Social Connection and Isolation Panel [NHANES]    Frequency of Communication with Friends and Family: More than three times a week    Frequency of Social Gatherings with Friends and Family: Three times a week    Attends Religious Services: Never    Active Member of Clubs or Organizations: No    Attends Archivist Meetings: Never    Marital Status: Married  Human resources officer Violence: Not At Risk (09/08/2019)   Humiliation, Afraid, Rape, and Kick questionnaire    Fear of Current or Ex-Partner: No    Emotionally Abused: No    Physically Abused: No    Sexually Abused: No    Current Meds  Medication Sig   Ascorbic Acid (VITAMIN C PO) Take 1 tablet by mouth every evening.    Cholecalciferol (VITAMIN D PO) Take 1 tablet by mouth every evening.   Cyanocobalamin (VITAMIN B-12 PO) Take 1 tablet by mouth every evening.   ELDERBERRY PO Take 1 capsule by mouth every evening.   fluconazole (DIFLUCAN) 150 MG tablet Take 1 tablet (150 mg total) by mouth once for 1 dose. Can repeat after 3 days prn   ibuprofen (ADVIL) 200 MG tablet Take 400 mg by mouth every 8 (eight) hours as needed (for pain.).   MAGNESIUM PO Take 200 mg by mouth daily.   Omega-3 Fatty Acids (OMEGA 3 PO) Take 1 capsule by mouth every evening.   Probiotic Product (PROBIOTIC BLEND  PO) Take 1 capsule by mouth every evening.     ROS:  Review of Systems  Constitutional:  Negative for fatigue, fever and unexpected weight change.  Respiratory:  Negative for cough, shortness of breath and wheezing.   Cardiovascular:  Negative for chest pain, palpitations and leg swelling.  Gastrointestinal:  Negative for blood in stool, constipation, diarrhea, nausea and vomiting.  Endocrine: Negative for cold intolerance, heat intolerance and polyuria.  Genitourinary:  Negative for dyspareunia, dysuria, flank pain, frequency, genital sores, hematuria, menstrual problem, pelvic pain, urgency, vaginal bleeding, vaginal discharge and vaginal pain.  Musculoskeletal:  Negative for back pain, joint swelling and myalgias.  Skin:  Negative for rash.  Neurological:  Negative for dizziness, syncope, light-headedness, numbness and headaches.  Hematological:  Negative for adenopathy.  Psychiatric/Behavioral:  Negative for agitation, confusion, sleep disturbance and suicidal ideas. The patient is not nervous/anxious.      Objective: BP 110/70   Ht '5\' 4"'$  (1.626 m)   Wt 147 lb (66.7 kg)   LMP 08/16/2016   BMI 25.23 kg/m    Physical Exam Constitutional:      Appearance: She is well-developed.  Genitourinary:     Vulva normal.     Genitourinary Comments: UTERUS/CX SURG REM     Right Labia: No rash, tenderness or lesions.     Left Labia: No tenderness, lesions or rash.    Vaginal cuff intact.    No vaginal discharge, erythema or tenderness.      Right Adnexa: not tender and no mass present.    Left Adnexa: not tender and no mass present.    Cervix is absent.     No cervical friability or polyp.     Uterus is not enlarged or tender.     Uterus is absent.  Breasts:    Right: No mass, nipple discharge, skin change or tenderness.     Left: No mass, nipple discharge, skin change or tenderness.  Neck:     Thyroid: No thyromegaly.  Cardiovascular:     Rate and Rhythm: Normal rate and regular rhythm.     Heart sounds: Normal heart sounds. No murmur heard. Pulmonary:     Effort: Pulmonary effort is normal.     Breath sounds: Normal breath sounds.  Abdominal:     Palpations: Abdomen is soft.     Tenderness: There is no abdominal tenderness. There is no guarding or rebound.  Musculoskeletal:        General: Normal range of motion.     Cervical back: Normal range of motion.  Lymphadenopathy:     Cervical: No cervical adenopathy.  Neurological:     General: No focal deficit present.     Mental Status: She is alert and oriented to person, place, and time.     Cranial Nerves: No cranial nerve deficit.  Skin:    General: Skin is warm and dry.  Psychiatric:        Mood and Affect: Mood normal.        Behavior: Behavior normal.        Thought Content: Thought content normal.        Judgment: Judgment normal.  Vitals reviewed.    Assessment/Plan: Encounter for annual routine gynecological examination  Encounter for screening mammogram for malignant neoplasm of breast - Plan: MM 3D SCREEN BREAST BILATERAL; pt to schedule mammo although hesitant. F/u prn.   Vaginal yeast infection - Plan: fluconazole (DIFLUCAN) 150 MG tablet; Rx RF. Will RF prn.   Meds ordered  this encounter  Medications   fluconazole (DIFLUCAN) 150 MG tablet    Sig: Take 1 tablet (150 mg total) by mouth once for 1 dose. Can repeat after 3  days prn    Dispense:  2 tablet    Refill:  3    Order Specific Question:   Supervising Provider    Answer:   Renaldo Reel    GYN counsel mammography screening, adequate intake of calcium and vitamin D, diet and exercise     F/U  Return in about 1 year (around 11/22/2023).  Durinda Buzzelli B. Anastasia Tompson, PA-C 11/21/2022 5:45 PM

## 2022-11-21 ENCOUNTER — Encounter: Payer: Self-pay | Admitting: Obstetrics and Gynecology

## 2022-11-21 ENCOUNTER — Ambulatory Visit (INDEPENDENT_AMBULATORY_CARE_PROVIDER_SITE_OTHER): Payer: 59 | Admitting: Obstetrics and Gynecology

## 2022-11-21 VITALS — BP 110/70 | Ht 64.0 in | Wt 147.0 lb

## 2022-11-21 DIAGNOSIS — Z01411 Encounter for gynecological examination (general) (routine) with abnormal findings: Secondary | ICD-10-CM | POA: Diagnosis not present

## 2022-11-21 DIAGNOSIS — B3731 Acute candidiasis of vulva and vagina: Secondary | ICD-10-CM

## 2022-11-21 DIAGNOSIS — Z01419 Encounter for gynecological examination (general) (routine) without abnormal findings: Secondary | ICD-10-CM

## 2022-11-21 DIAGNOSIS — Z1231 Encounter for screening mammogram for malignant neoplasm of breast: Secondary | ICD-10-CM

## 2022-11-21 MED ORDER — FLUCONAZOLE 150 MG PO TABS: 150.0000 mg | ORAL_TABLET | Freq: Once | ORAL | 3 refills | Status: DC

## 2022-11-21 NOTE — Patient Instructions (Signed)
I value your feedback and you entrusting us with your care. If you get a [facility] patient survey, I would appreciate you taking the time to let us know about your experience today. Thank you!  [facility]: [phone]      

## 2022-12-02 ENCOUNTER — Ambulatory Visit (INDEPENDENT_AMBULATORY_CARE_PROVIDER_SITE_OTHER): Payer: 59 | Admitting: Gastroenterology

## 2022-12-02 ENCOUNTER — Encounter: Payer: Self-pay | Admitting: Gastroenterology

## 2022-12-02 VITALS — BP 137/95 | HR 73 | Temp 98.2°F | Ht 64.0 in | Wt 146.0 lb

## 2022-12-02 DIAGNOSIS — K589 Irritable bowel syndrome without diarrhea: Secondary | ICD-10-CM

## 2022-12-02 NOTE — Progress Notes (Signed)
Primary Care Physician: Patient, No Pcp Per  Primary Gastroenterologist:  Dr. Lucilla Lame  Chief Complaint  Patient presents with   Change in Bowel Habits    Pt reports changes in color and notice of mucus in stools... Pt also reports intermittent abd discomfort--- denies blood in stool, rectal pain, N/V/D or fever    HPI: Betty Rodgers is a 47 y.o. female here with a history of seeing me for a colonoscopy back in June of last year.  At that time the patient was found to have a tubular adenoma and a repeat colonoscopy was recommended in 7 years.  The patient is now here with report of change in bowel habits. The patient reports that her father had colon cancer at the age of 61.  The patient also reports that the reason she is here today is because since having multiple rounds of antibiotics for a breast abscess last year she has had a change in bowel habits with a change in frequency with her stools falling apart and leaching brown color into the water in addition to streaking the bottom of the toilet.  The patient states that this is all been new for her.  The patient does endorse plenty of stress in her life and reports that last year was a terrible year for her.  Past Medical History:  Diagnosis Date   Acute abdominal pain in right lower quadrant 03/25/2016   Anxiety disorder 09/27/2015   Anxiety state, unspecified    Chronic lymphocytic thyroiditis    H/O: hysterectomy    History of Crohn's disease    Irritable bowel syndrome    Other chest pain    Other specified congenital anomaly of skin    Sciatica    Unspecified chronic bronchitis (HCC)    Unspecified sinusitis (chronic)    Vaginitis and vulvovaginitis, unspecified     Current Outpatient Medications  Medication Sig Dispense Refill   Ascorbic Acid (VITAMIN C PO) Take 1 tablet by mouth every evening.     Cholecalciferol (VITAMIN D PO) Take 1 tablet by mouth every evening.     Cyanocobalamin (VITAMIN B-12 PO) Take 1  tablet by mouth every evening.     ELDERBERRY PO Take 1 capsule by mouth every evening.     ibuprofen (ADVIL) 200 MG tablet Take 400 mg by mouth every 8 (eight) hours as needed (for pain.).     MAGNESIUM PO Take 200 mg by mouth daily.     Omega-3 Fatty Acids (OMEGA 3 PO) Take 1 capsule by mouth every evening.     Probiotic Product (PROBIOTIC BLEND PO) Take 1 capsule by mouth every evening.     No current facility-administered medications for this visit.    Allergies as of 12/02/2022 - Review Complete 12/02/2022  Allergen Reaction Noted   Bee venom Swelling 01/13/2013    ROS:  General: Negative for anorexia, weight loss, fever, chills, fatigue, weakness. ENT: Negative for hoarseness, difficulty swallowing , nasal congestion. CV: Negative for chest pain, angina, palpitations, dyspnea on exertion, peripheral edema.  Respiratory: Negative for dyspnea at rest, dyspnea on exertion, cough, sputum, wheezing.  GI: See history of present illness. GU:  Negative for dysuria, hematuria, urinary incontinence, urinary frequency, nocturnal urination.  Endo: Negative for unusual weight change.    Physical Examination:   BP (!) 137/95 (BP Location: Left Arm, Patient Position: Sitting, Cuff Size: Normal)   Pulse 73   Temp 98.2 F (36.8 C) (Oral)   Ht '5\' 4"'$  (  1.626 m)   Wt 146 lb (66.2 kg)   LMP 08/16/2016   BMI 25.06 kg/m   General: Well-nourished, well-developed in no acute distress.  Eyes: No icterus. Conjunctivae pink. Lungs: Clear to auscultation bilaterally. Non-labored. Heart: Regular rate and rhythm, no murmurs rubs or gallops.  Abdomen: Bowel sounds are normal, nontender, nondistended, no hepatosplenomegaly or masses, no abdominal bruits or hernia , no rebound or guarding.   Extremities: No lower extremity edema. No clubbing or deformities. Neuro: Alert and oriented x 3.  Grossly intact. Skin: Warm and dry, no jaundice.   Psych: Alert and cooperative, normal mood and affect.  Labs:     Imaging Studies: No results found.  Assessment and Plan:   Betty Rodgers is a 47 y.o. y/o female who comes in today with what appears to be irritable bowel syndrome and she has had this for some time with a report of change in stool character and frequency in addition to lower abdominal cramps.  The patient was seen by her OB/GYN and recommended to come see me.  She states that she gets cramps in the morning usually approximately 4 times a week.  The pain is usually relieved if she goes to the bathroom.  She also has some anal leakage from her softer stools.  The patient has been told of the options of increasing fiber in her diet, taking a probiotic such as VSL #3, using liquid Imodium to titrate her stools to be somewhat hotter and possibly starting on a antispasmodic such as dicyclomine.  The patient has also been told about a low FODMAP diet.  The patient would like to try the low FODMAP diet and stay away from medications at the present time.  She will try the fiber also and if that does not work she will try the probiotic.  The patient has been told to contact me if she decides that she wants to start a antispasmodic and I can send the prescription for her and at that time.  The patient has been explained the plan and agree with it.     Lucilla Lame, MD. Marval Regal    Note: This dictation was prepared with Dragon dictation along with smaller phrase technology. Any transcriptional errors that result from this process are unintentional.

## 2023-05-09 NOTE — Progress Notes (Signed)
Patient, No Pcp Per   No chief complaint on file.   HPI:      Betty Rodgers is a 47 y.o. G0P0000 whose LMP was Patient's last menstrual period was 08/16/2016., presents today for ***  Her menses are absent due to lap hyst/salpingectomy for menorrhagia and leio 2017 with Dr. Tiburcio Pea.  Diagnosed with IBS with Dr. Servando Snare 1/24  Patient Active Problem List   Diagnosis Date Noted   History of colonic polyps    Polyp of sigmoid colon    Subareolar mass of right breast 01/14/2022   Carpal tunnel syndrome 07/24/2021   Candidal vaginitis 09/18/2020   Diarrhea 02/11/2018   Dizziness 02/11/2018   Acute cystitis without hematuria 02/11/2018   Family history of diabetes mellitus in father 10/29/2016   Well woman exam without gynecological exam 10/29/2016   Fibroid uterus 08/20/2016   Abnormal findings-gastrointestinal tract    Other specified diseases of intestine    Benign neoplasm of transverse colon    Rectal polyp    Acute abdominal pain in right lower quadrant 03/25/2016   Anxiety disorder 09/27/2015    Past Surgical History:  Procedure Laterality Date   ABDOMINAL HYSTERECTOMY     COLONOSCOPY WITH PROPOFOL N/A 05/09/2016   Procedure: COLONOSCOPY WITH PROPOFOL;  Surgeon: Midge Minium, MD;  Location: [facility];  Service: Endoscopy;  Laterality: N/A;  PLEASE LEAVE EARLY AS POSSIBLE   COLONOSCOPY WITH PROPOFOL N/A 05/03/2022   Procedure: COLONOSCOPY WITH BIOPSY;  Surgeon: Midge Minium, MD;  Location: [facility];  Service: Endoscopy;  Laterality: N/A;   CYSTOSCOPY N/A 08/20/2016   Procedure: CYSTOSCOPY;  Surgeon: Nadara Mustard, MD;  Location: [facility];  Service: Gynecology;  Laterality: N/A;   INCISION AND DRAINAGE ABSCESS Right 02/27/2022   Procedure: INCISION AND DRAINAGE ABSCESS;  Surgeon: Earline Mayotte, MD;  Location: [facility];  Service: General;  Laterality: Right;   LAPAROSCOPIC BILATERAL SALPINGECTOMY Bilateral 08/20/2016   Procedure: LAPAROSCOPIC  BILATERAL SALPINGECTOMY;  Surgeon: Nadara Mustard, MD;  Location: [facility];  Service: Gynecology;  Laterality: Bilateral;   LAPAROSCOPIC HYSTERECTOMY N/A 08/20/2016   Procedure: HYSTERECTOMY TOTAL LAPAROSCOPIC;  Surgeon: Nadara Mustard, MD;  Location: [facility];  Service: Gynecology;  Laterality: N/A;   POLYPECTOMY  05/09/2016   Procedure: POLYPECTOMY;  Surgeon: Midge Minium, MD;  Location: [facility];  Service: Endoscopy;;   POLYPECTOMY N/A 05/03/2022   Procedure: POLYPECTOMY;  Surgeon: Midge Minium, MD;  Location: [facility];  Service: Endoscopy;  Laterality: N/A;   WISDOM TOOTH EXTRACTION      Family History  Problem Relation Age of Onset   Uterine cancer Mother        65s   Cancer Father        started in colon and has spreaded   Hypertension Father    Heart disease Father    Diabetes Father        Controlled   Heart attack Paternal Grandmother    Breast cancer Neg Hx     Social History   Socioeconomic History   Marital status: Single    Spouse name: Not on file   Number of children: Not on file   Years of education: Not on file   Highest education level: Not on file  Occupational History   Not on file  Tobacco Use   Smoking status: Never   Smokeless tobacco: Never  Vaping Use   Vaping Use: Never used  Substance and Sexual Activity  Alcohol use: Not Currently   Drug use: No   Sexual activity: Yes    Partners: Male    Birth control/protection: Surgical    Comment: Hysterectomy  Other Topics Concern   Not on file  Social History Narrative   Not on file   Social Determinants of Health   Financial Resource Strain: Not on file  Food Insecurity: Not on file  Transportation Needs: Not on file  Physical Activity: Insufficiently Active (09/08/2019)   Exercise Vital Sign    Days of Exercise per Week: 1 day    Minutes of Exercise per Session: 60 min  Stress: Stress Concern Present (09/08/2019)   [employer] of Occupational Health -  Occupational Stress Questionnaire    Feeling of Stress : To some extent  Social Connections: Somewhat Isolated (09/08/2019)   Social Connection and Isolation Panel [NHANES]    Frequency of Communication with Friends and Family: More than three times a week    Frequency of Social Gatherings with Friends and Family: Three times a week    Attends Religious Services: Never    Active Member of Clubs or Organizations: No    Attends Banker Meetings: Never    Marital Status: Married  Catering manager Violence: Not At Risk (09/08/2019)   Humiliation, Afraid, Rape, and Kick questionnaire    Fear of Current or Ex-Partner: No    Emotionally Abused: No    Physically Abused: No    Sexually Abused: No    Outpatient Medications Prior to Visit  Medication Sig Dispense Refill   Ascorbic Acid (VITAMIN C PO) Take 1 tablet by mouth every evening.     Cholecalciferol (VITAMIN D PO) Take 1 tablet by mouth every evening.     Cyanocobalamin (VITAMIN B-12 PO) Take 1 tablet by mouth every evening.     ELDERBERRY PO Take 1 capsule by mouth every evening.     ibuprofen (ADVIL) 200 MG tablet Take 400 mg by mouth every 8 (eight) hours as needed (for pain.).     MAGNESIUM PO Take 200 mg by mouth daily.     Omega-3 Fatty Acids (OMEGA 3 PO) Take 1 capsule by mouth every evening.     Probiotic Product (PROBIOTIC BLEND PO) Take 1 capsule by mouth every evening.     No facility-administered medications prior to visit.      ROS:  Review of Systems BREAST: No symptoms   OBJECTIVE:   Vitals:  LMP 08/16/2016   Physical Exam  Results: No results found for this or any previous visit (from the past 24 hour(s)).   Assessment/Plan: No diagnosis found.    No orders of the defined types were placed in this encounter.     No follow-ups on file.  Jarmel Linhardt B. Sixto Bowdish, PA-C 05/09/2023 2:52 PM

## 2023-05-12 ENCOUNTER — Ambulatory Visit (INDEPENDENT_AMBULATORY_CARE_PROVIDER_SITE_OTHER): Payer: 59 | Admitting: Obstetrics and Gynecology

## 2023-05-12 ENCOUNTER — Encounter: Payer: Self-pay | Admitting: Obstetrics and Gynecology

## 2023-05-12 VITALS — BP 106/70 | Ht 63.0 in | Wt 143.0 lb

## 2023-05-12 DIAGNOSIS — Z780 Asymptomatic menopausal state: Secondary | ICD-10-CM | POA: Diagnosis not present

## 2023-05-12 DIAGNOSIS — R232 Flushing: Secondary | ICD-10-CM | POA: Diagnosis not present

## 2023-05-12 DIAGNOSIS — Z1329 Encounter for screening for other suspected endocrine disorder: Secondary | ICD-10-CM

## 2023-05-13 ENCOUNTER — Encounter: Payer: Self-pay | Admitting: Obstetrics and Gynecology

## 2023-05-13 ENCOUNTER — Other Ambulatory Visit: Payer: Self-pay | Admitting: Obstetrics and Gynecology

## 2023-05-13 DIAGNOSIS — Z780 Asymptomatic menopausal state: Secondary | ICD-10-CM

## 2023-05-13 LAB — TSH+FREE T4
Free T4: 1.1 ng/dL (ref 0.82–1.77)
TSH: 1.83 u[IU]/mL (ref 0.450–4.500)

## 2023-05-13 LAB — ESTRADIOL: Estradiol: 9.7 pg/mL

## 2023-05-13 LAB — FOLLICLE STIMULATING HORMONE: FSH: 153 m[IU]/mL

## 2023-05-13 MED ORDER — ESTRADIOL 0.05 MG/24HR TD PTWK
0.0500 mg | MEDICATED_PATCH | TRANSDERMAL | 1 refills | Status: DC
Start: 2023-05-13 — End: 2023-12-02

## 2023-05-13 NOTE — Progress Notes (Signed)
Rx climara 0.05 mg for new dx menopause/sx.

## 2023-12-01 NOTE — Progress Notes (Unsigned)
PCP:  Patient, No Pcp Per   No chief complaint on file.    HPI:      Betty Rodgers is a 48 y.o. G0P0000 who LMP was Patient's last menstrual period was 08/16/2016., presents today for her annual examination.  Her menses are absent due to lap hyst/salpingectomy for menorrhagia and leio 2017 with Dr. Tiburcio Pea.  She does not have PMB. Occas vasomotor sx. Gets occas BLQ twinges. Started ERT 7/24 after labs suggestive of menopause.   Sex activity: single partner, contraception - status post hysterectomy. No pain/bleeding.  Last Pap: 09/08/19  Results were: no abnormalities /neg HPV DNA. Hx of STDs: none  Hx of recurrent yeast vag past couple of yrs. Pt using dove sens skin soap, no dryer sheets. Wears thongs regularly and doesn't plan to stop. :-) I send diflucan RF in prn sx.  Pt needs RF  Last mammogram: 12/31/21  Results were: normal, repeat in 12 months; hx of cat 3 RT breast, normal repeat imaging 04/17/21. Pt with breast abscess 5/23 after mammo, requiring surgery with Dr. Lemar Livings. Pt concerned mammo caused it and is hesitant to get another one right now.  There is no FH of breast cancer. There is no FH of ovarian cancer. Her biological mom died last yr from uterine cancer in her 53s. The patient does self-breast exams.  Tobacco use: The patient denies current or previous tobacco use. Alcohol use: none No drug use.  Exercise: not active  Colonoscopy: 6/23 with Dr. Servando Snare with polyps; 2017 with polyps; repeat due after 7 yrs now. Dad with hx of stage 4 colon cancer dx  She does get adequate calcium and Vitamin D in her diet.  Normal fasting labs 11/21 and 11/22 (recent labs including thyroid panel, lipids, CMP, HgA1C)   Past Medical History:  Diagnosis Date   Acute abdominal pain in right lower quadrant 03/25/2016   Anxiety disorder 09/27/2015   Anxiety state, unspecified    Chronic lymphocytic thyroiditis    H/O: hysterectomy    History of Crohn's disease    Irritable  bowel syndrome    Other chest pain    Other specified congenital anomaly of skin    Sciatica    Unspecified chronic bronchitis (HCC)    Unspecified sinusitis (chronic)    Vaginitis and vulvovaginitis, unspecified     Past Surgical History:  Procedure Laterality Date   ABDOMINAL HYSTERECTOMY     COLONOSCOPY WITH PROPOFOL N/A 05/09/2016   Procedure: COLONOSCOPY WITH PROPOFOL;  Surgeon: Midge Minium, MD;  Location: [facility];  Service: Endoscopy;  Laterality: N/A;  PLEASE LEAVE EARLY AS POSSIBLE   COLONOSCOPY WITH PROPOFOL N/A 05/03/2022   Procedure: COLONOSCOPY WITH BIOPSY;  Surgeon: Midge Minium, MD;  Location: [facility];  Service: Endoscopy;  Laterality: N/A;   CYSTOSCOPY N/A 08/20/2016   Procedure: CYSTOSCOPY;  Surgeon: Nadara Mustard, MD;  Location: [facility];  Service: Gynecology;  Laterality: N/A;   INCISION AND DRAINAGE ABSCESS Right 02/27/2022   Procedure: INCISION AND DRAINAGE ABSCESS;  Surgeon: Earline Mayotte, MD;  Location: [facility];  Service: General;  Laterality: Right;   LAPAROSCOPIC BILATERAL SALPINGECTOMY Bilateral 08/20/2016   Procedure: LAPAROSCOPIC BILATERAL SALPINGECTOMY;  Surgeon: Nadara Mustard, MD;  Location: [facility];  Service: Gynecology;  Laterality: Bilateral;   LAPAROSCOPIC HYSTERECTOMY N/A 08/20/2016   Procedure: HYSTERECTOMY TOTAL LAPAROSCOPIC;  Surgeon: Nadara Mustard, MD;  Location: [facility];  Service: Gynecology;  Laterality: N/A;   POLYPECTOMY  05/09/2016  Procedure: POLYPECTOMY;  Surgeon: Midge Minium, MD;  Location: [facility];  Service: Endoscopy;;   POLYPECTOMY N/A 05/03/2022   Procedure: POLYPECTOMY;  Surgeon: Midge Minium, MD;  Location: [facility];  Service: Endoscopy;  Laterality: N/A;   WISDOM TOOTH EXTRACTION      Family History  Problem Relation Age of Onset   Uterine cancer Mother        7s   Cancer Father        started in colon and has spreaded   Hypertension Father    Heart disease Father     Diabetes Father        Controlled   Heart attack Paternal Grandmother    Breast cancer Neg Hx     Social History   Socioeconomic History   Marital status: Single    Spouse name: Not on file   Number of children: Not on file   Years of education: Not on file   Highest education level: Not on file  Occupational History   Not on file  Tobacco Use   Smoking status: Never   Smokeless tobacco: Never  Vaping Use   Vaping status: Never Used  Substance and Sexual Activity   Alcohol use: Not Currently   Drug use: No   Sexual activity: Yes    Partners: Male    Birth control/protection: Surgical    Comment: Hysterectomy  Other Topics Concern   Not on file  Social History Narrative   Not on file   Social Drivers of Health   Financial Resource Strain: Not on file  Food Insecurity: Not on file  Transportation Needs: Not on file  Physical Activity: Insufficiently Active (09/08/2019)   Exercise Vital Sign    Days of Exercise per Week: 1 day    Minutes of Exercise per Session: 60 min  Stress: Stress Concern Present (09/08/2019)   [employer] of Occupational Health - Occupational Stress Questionnaire    Feeling of Stress : To some extent  Social Connections: Somewhat Isolated (09/08/2019)   Social Connection and Isolation Panel [NHANES]    Frequency of Communication with Friends and Family: More than three times a week    Frequency of Social Gatherings with Friends and Family: Three times a week    Attends Religious Services: Never    Active Member of Clubs or Organizations: No    Attends Banker Meetings: Never    Marital Status: Married  Catering manager Violence: Not At Risk (09/08/2019)   Humiliation, Afraid, Rape, and Kick questionnaire    Fear of Current or Ex-Partner: No    Emotionally Abused: No    Physically Abused: No    Sexually Abused: No    No outpatient medications have been marked as taking for the 12/02/23 encounter (Appointment) with Glorianne Proctor,  Ilona Sorrel, PA-C.     ROS:  Review of Systems  Constitutional:  Negative for fatigue, fever and unexpected weight change.  Respiratory:  Negative for cough, shortness of breath and wheezing.   Cardiovascular:  Negative for chest pain, palpitations and leg swelling.  Gastrointestinal:  Negative for blood in stool, constipation, diarrhea, nausea and vomiting.  Endocrine: Negative for cold intolerance, heat intolerance and polyuria.  Genitourinary:  Negative for dyspareunia, dysuria, flank pain, frequency, genital sores, hematuria, menstrual problem, pelvic pain, urgency, vaginal bleeding, vaginal discharge and vaginal pain.  Musculoskeletal:  Negative for back pain, joint swelling and myalgias.  Skin:  Negative for rash.  Neurological:  Negative for dizziness, syncope, light-headedness, numbness  and headaches.  Hematological:  Negative for adenopathy.  Psychiatric/Behavioral:  Negative for agitation, confusion, sleep disturbance and suicidal ideas. The patient is not nervous/anxious.      Objective: LMP 08/16/2016    Physical Exam Constitutional:      Appearance: She is well-developed.  Genitourinary:     Vulva normal.     Genitourinary Comments: UTERUS/CX SURG REM     Right Labia: No rash, tenderness or lesions.    Left Labia: No tenderness, lesions or rash.    Vaginal cuff intact.    No vaginal discharge, erythema or tenderness.      Right Adnexa: not tender and no mass present.    Left Adnexa: not tender and no mass present.    Cervix is absent.     No cervical friability or polyp.     Uterus is not enlarged or tender.     Uterus is absent.  Breasts:    Right: No mass, nipple discharge, skin change or tenderness.     Left: No mass, nipple discharge, skin change or tenderness.  Neck:     Thyroid: No thyromegaly.  Cardiovascular:     Rate and Rhythm: Normal rate and regular rhythm.     Heart sounds: Normal heart sounds. No murmur heard. Pulmonary:     Effort: Pulmonary  effort is normal.     Breath sounds: Normal breath sounds.  Abdominal:     Palpations: Abdomen is soft.     Tenderness: There is no abdominal tenderness. There is no guarding or rebound.  Musculoskeletal:        General: Normal range of motion.     Cervical back: Normal range of motion.  Lymphadenopathy:     Cervical: No cervical adenopathy.  Neurological:     General: No focal deficit present.     Mental Status: She is alert and oriented to person, place, and time.     Cranial Nerves: No cranial nerve deficit.  Skin:    General: Skin is warm and dry.  Psychiatric:        Mood and Affect: Mood normal.        Behavior: Behavior normal.        Thought Content: Thought content normal.        Judgment: Judgment normal.  Vitals reviewed.    Assessment/Plan: Encounter for annual routine gynecological examination  Encounter for screening mammogram for malignant neoplasm of breast - Plan: MM 3D SCREEN BREAST BILATERAL; pt to schedule mammo although hesitant. F/u prn.   Vaginal yeast infection - Plan: fluconazole (DIFLUCAN) 150 MG tablet; Rx RF. Will RF prn.   No orders of the defined types were placed in this encounter.   GYN counsel mammography screening, adequate intake of calcium and vitamin D, diet and exercise     F/U  No follow-ups on file.  Betty Cogliano B. Latanja Lehenbauer, PA-C 12/01/2023 2:58 PM

## 2023-12-02 ENCOUNTER — Ambulatory Visit (INDEPENDENT_AMBULATORY_CARE_PROVIDER_SITE_OTHER): Payer: Commercial Managed Care - PPO | Admitting: Obstetrics and Gynecology

## 2023-12-02 ENCOUNTER — Encounter: Payer: Self-pay | Admitting: Obstetrics and Gynecology

## 2023-12-02 VITALS — BP 113/76 | HR 147 | Ht 64.0 in | Wt 153.0 lb

## 2023-12-02 DIAGNOSIS — N898 Other specified noninflammatory disorders of vagina: Secondary | ICD-10-CM

## 2023-12-02 DIAGNOSIS — Z780 Asymptomatic menopausal state: Secondary | ICD-10-CM

## 2023-12-02 DIAGNOSIS — Z1151 Encounter for screening for human papillomavirus (HPV): Secondary | ICD-10-CM

## 2023-12-02 DIAGNOSIS — Z1231 Encounter for screening mammogram for malignant neoplasm of breast: Secondary | ICD-10-CM

## 2023-12-02 DIAGNOSIS — Z01419 Encounter for gynecological examination (general) (routine) without abnormal findings: Secondary | ICD-10-CM

## 2023-12-02 DIAGNOSIS — Z0000 Encounter for general adult medical examination without abnormal findings: Secondary | ICD-10-CM

## 2023-12-02 DIAGNOSIS — Z124 Encounter for screening for malignant neoplasm of cervix: Secondary | ICD-10-CM

## 2023-12-02 DIAGNOSIS — Z13220 Encounter for screening for lipoid disorders: Secondary | ICD-10-CM

## 2023-12-02 DIAGNOSIS — R232 Flushing: Secondary | ICD-10-CM

## 2023-12-02 DIAGNOSIS — B3731 Acute candidiasis of vulva and vagina: Secondary | ICD-10-CM

## 2023-12-02 MED ORDER — FLUCONAZOLE 150 MG PO TABS
150.0000 mg | ORAL_TABLET | Freq: Once | ORAL | 3 refills | Status: AC
Start: 2023-12-02 — End: 2023-12-02

## 2023-12-02 NOTE — Patient Instructions (Signed)
I value your feedback and you entrusting [place] with your care. If you get a [facility] patient survey, I would appreciate you taking the time to let us know about your experience today. Thank you!  [facility] ([place])--[phone]

## 2023-12-03 LAB — CBC WITH DIFFERENTIAL/PLATELET
Basophils Absolute: 0.1 10*3/uL (ref 0.0–0.2)
Basos: 1 %
EOS (ABSOLUTE): 0.2 10*3/uL (ref 0.0–0.4)
Eos: 3 %
Hematocrit: 47.9 % — ABNORMAL HIGH (ref 34.0–46.6)
Hemoglobin: 15.9 g/dL (ref 11.1–15.9)
Immature Grans (Abs): 0 10*3/uL (ref 0.0–0.1)
Immature Granulocytes: 0 %
Lymphocytes Absolute: 2.4 10*3/uL (ref 0.7–3.1)
Lymphs: 34 %
MCH: 29.8 pg (ref 26.6–33.0)
MCHC: 33.2 g/dL (ref 31.5–35.7)
MCV: 90 fL (ref 79–97)
Monocytes Absolute: 0.4 10*3/uL (ref 0.1–0.9)
Monocytes: 5 %
Neutrophils Absolute: 4 10*3/uL (ref 1.4–7.0)
Neutrophils: 57 %
Platelets: 302 10*3/uL (ref 150–450)
RBC: 5.34 x10E6/uL — ABNORMAL HIGH (ref 3.77–5.28)
RDW: 12.3 % (ref 11.7–15.4)
WBC: 7 10*3/uL (ref 3.4–10.8)

## 2023-12-03 LAB — LIPID PANEL
Chol/HDL Ratio: 2.2 {ratio} (ref 0.0–4.4)
Cholesterol, Total: 208 mg/dL — ABNORMAL HIGH (ref 100–199)
HDL: 93 mg/dL (ref >39)
LDL Chol Calc (NIH): 106 mg/dL — ABNORMAL HIGH (ref 0–99)
Triglycerides: 48 mg/dL (ref 0–149)
VLDL Cholesterol Cal: 9 mg/dL (ref 5–40)

## 2023-12-03 LAB — COMPREHENSIVE METABOLIC PANEL
ALT: 12 [IU]/L (ref 0–32)
AST: 16 [IU]/L (ref 0–40)
Albumin: 4.4 g/dL (ref 3.9–4.9)
Alkaline Phosphatase: 73 [IU]/L (ref 44–121)
BUN/Creatinine Ratio: 16 (ref 9–23)
BUN: 13 mg/dL (ref 6–24)
Bilirubin Total: 0.4 mg/dL (ref 0.0–1.2)
CO2: 23 mmol/L (ref 20–29)
Calcium: 9.4 mg/dL (ref 8.7–10.2)
Chloride: 103 mmol/L (ref 96–106)
Creatinine, Ser: 0.8 mg/dL (ref 0.57–1.00)
Globulin, Total: 2.3 g/dL (ref 1.5–4.5)
Glucose: 87 mg/dL (ref 70–99)
Potassium: 4.2 mmol/L (ref 3.5–5.2)
Sodium: 140 mmol/L (ref 134–144)
Total Protein: 6.7 g/dL (ref 6.0–8.5)
eGFR: 90 mL/min/{1.73_m2} (ref >59)

## 2023-12-04 ENCOUNTER — Encounter: Payer: Self-pay | Admitting: Obstetrics and Gynecology

## 2024-03-10 ENCOUNTER — Ambulatory Visit
Admission: RE | Admit: 2024-03-10 | Discharge: 2024-03-10 | Disposition: A | Discharge status: Home / Self Care | Source: Ambulatory Visit | Attending: Obstetrics and Gynecology | Admitting: Obstetrics and Gynecology

## 2024-03-10 DIAGNOSIS — Z1231 Encounter for screening mammogram for malignant neoplasm of breast: Secondary | ICD-10-CM | POA: Insufficient documentation

## 2024-03-11 ENCOUNTER — Encounter: Payer: Self-pay | Admitting: Obstetrics and Gynecology

## 2024-03-11 ENCOUNTER — Other Ambulatory Visit: Payer: Self-pay | Admitting: Obstetrics and Gynecology

## 2024-03-11 DIAGNOSIS — R928 Other abnormal and inconclusive findings on diagnostic imaging of breast: Secondary | ICD-10-CM

## 2024-03-16 ENCOUNTER — Ambulatory Visit
Admission: RE | Admit: 2024-03-16 | Discharge: 2024-03-16 | Disposition: A | Discharge status: Home / Self Care | Source: Ambulatory Visit | Attending: Obstetrics and Gynecology | Admitting: Obstetrics and Gynecology

## 2024-03-16 ENCOUNTER — Encounter

## 2024-03-16 ENCOUNTER — Ambulatory Visit: Payer: Self-pay | Admitting: Obstetrics and Gynecology

## 2024-03-16 ENCOUNTER — Ambulatory Visit
Admission: RE | Admit: 2024-03-16 | Discharge: 2024-03-16 | Discharge status: Home / Self Care | Source: Ambulatory Visit | Attending: Obstetrics and Gynecology

## 2024-03-16 ENCOUNTER — Other Ambulatory Visit

## 2024-03-16 DIAGNOSIS — R928 Other abnormal and inconclusive findings on diagnostic imaging of breast: Secondary | ICD-10-CM

## 2024-12-06 NOTE — Progress Notes (Unsigned)
 "  PCP:  Patient, No Pcp Per   No chief complaint on file.    HPI:      [name] is a 49 y.o. G0P0000 who LMP was Patient's last menstrual period was 08/16/2016., presents today for her annual examination.  Her menses are absent due to lap hyst/salpingectomy for menorrhagia and leio 2017 with Dr. Arloa.  She does not have PMB. Started with increased VS sx 7/24 and labs suggestive of menopause; Rx climara  eRxd but pt tried estroven instead and it's working well.   Sex activity: single partner, contraception - status post hysterectomy. No bleeding. Does have some dryness and pain, hasn't tried lubricants.   Last Pap: 09/08/19  Results were: no abnormalities /neg HPV DNA. S/p hyst.  Hx of STDs: none  Hx of recurrent yeast vag past couple of yrs. Pt using dove sens skin soap, no dryer sheets. Wears thongs regularly and doesn't plan to stop. :-) I send diflucan  RF in prn sx.  Pt needs RF today.   Last mammogram: 03/16/24  Results were: normal, repeat in 12 months. Pt with breast abscess 5/23 after mammo, requiring surgery with Dr. Dessa. Pt concerned mammo caused it. There is no FH of breast cancer. There is no FH of ovarian cancer. Her biological mom died last yr from uterine cancer in her 70s. The patient does self-breast exams.  Tobacco use: The patient denies current or previous tobacco use. Alcohol use: none No drug use.  Exercise: not active  Colonoscopy: 6/23 with Dr. Jinny with polyps; 2017 with polyps; repeat due after 7 yrs now. Dad with hx of stage 4 colon cancer dx  She does get adequate calcium and Vitamin D  in her diet.  Normal fasting labs 1/25   Past Medical History:  Diagnosis Date   Acute abdominal pain in right lower quadrant 03/25/2016   Anxiety disorder 09/27/2015   Anxiety state, unspecified    Chronic lymphocytic thyroiditis    H/O: hysterectomy    History of Crohn's disease    Irritable bowel syndrome    Other chest pain    Other specified  congenital anomaly of skin    Sciatica    Unspecified chronic bronchitis (HCC)    Unspecified sinusitis (chronic)    Vaginitis and vulvovaginitis, unspecified     Past Surgical History:  Procedure Laterality Date   ABDOMINAL HYSTERECTOMY     BREAST EXCISIONAL BIOPSY Right 2024   COLONOSCOPY WITH PROPOFOL  N/A 05/09/2016   Procedure: COLONOSCOPY WITH PROPOFOL ;  Surgeon: Rogelia Jinny, MD;  Location: [facility];  Service: Endoscopy;  Laterality: N/A;  PLEASE LEAVE EARLY AS POSSIBLE   COLONOSCOPY WITH PROPOFOL  N/A 05/03/2022   Procedure: COLONOSCOPY WITH BIOPSY;  Surgeon: Jinny Rogelia, MD;  Location: [facility];  Service: Endoscopy;  Laterality: N/A;   CYSTOSCOPY N/A 08/20/2016   Procedure: CYSTOSCOPY;  Surgeon: Lamar SHAUNNA Arloa, MD;  Location: [facility];  Service: Gynecology;  Laterality: N/A;   INCISION AND DRAINAGE ABSCESS Right 02/27/2022   Procedure: INCISION AND DRAINAGE ABSCESS;  Surgeon: Dessa Reyes ORN, MD;  Location: [facility];  Service: General;  Laterality: Right;   LAPAROSCOPIC BILATERAL SALPINGECTOMY Bilateral 08/20/2016   Procedure: LAPAROSCOPIC BILATERAL SALPINGECTOMY;  Surgeon: Lamar SHAUNNA Arloa, MD;  Location: [facility];  Service: Gynecology;  Laterality: Bilateral;   LAPAROSCOPIC HYSTERECTOMY N/A 08/20/2016   Procedure: HYSTERECTOMY TOTAL LAPAROSCOPIC;  Surgeon: Lamar SHAUNNA Arloa, MD;  Location: [facility];  Service: Gynecology;  Laterality: N/A;   POLYPECTOMY  05/09/2016  Procedure: POLYPECTOMY;  Surgeon: Rogelia Copping, MD;  Location: [facility];  Service: Endoscopy;;   POLYPECTOMY N/A 05/03/2022   Procedure: POLYPECTOMY;  Surgeon: Copping Rogelia, MD;  Location: [facility];  Service: Endoscopy;  Laterality: N/A;   WISDOM TOOTH EXTRACTION      Family History  Problem Relation Age of Onset   Uterine cancer Mother        57s   Cancer Father        started in colon and has spreaded   Hypertension Father    Heart disease Father    Diabetes  Father        Controlled   Heart attack Paternal Grandmother    Breast cancer Neg Hx     Social History   Socioeconomic History   Marital status: Single    Spouse name: Not on file   Number of children: Not on file   Years of education: Not on file   Highest education level: Not on file  Occupational History   Not on file  Tobacco Use   Smoking status: Never   Smokeless tobacco: Never  Vaping Use   Vaping status: Never Used  Substance and Sexual Activity   Alcohol use: Not Currently   Drug use: No   Sexual activity: Yes    Partners: Male    Birth control/protection: Surgical    Comment: Hysterectomy  Other Topics Concern   Not on file  Social History Narrative   Not on file   Social Drivers of Health   Tobacco Use: Low Risk (03/10/2024)   Patient History    Smoking Tobacco Use: Never    Smokeless Tobacco Use: Never    Passive Exposure: Not on file  Financial Resource Strain: Not on file  Food Insecurity: Not on file  Transportation Needs: Not on file  Physical Activity: Not on file  Stress: Not on file  Social Connections: Not on file  Intimate Partner Violence: Not on file  Depression (EYV7-0): Not on file  Alcohol Screen: Not on file  Housing: Not on file  Utilities: Not on file  Health Literacy: Not on file    No outpatient medications have been marked as taking for the 12/09/24 encounter (Appointment) with Arrington Bencomo B, PA-C.     ROS:  Review of Systems  Constitutional:  Negative for fatigue, fever and unexpected weight change.  Respiratory:  Negative for cough, shortness of breath and wheezing.   Cardiovascular:  Negative for chest pain, palpitations and leg swelling.  Gastrointestinal:  Negative for blood in stool, constipation, diarrhea, nausea and vomiting.  Endocrine: Negative for cold intolerance, heat intolerance and polyuria.  Genitourinary:  Negative for dyspareunia, dysuria, flank pain, frequency, genital sores, hematuria, menstrual  problem, pelvic pain, urgency, vaginal bleeding, vaginal discharge and vaginal pain.  Musculoskeletal:  Negative for back pain, joint swelling and myalgias.  Skin:  Negative for rash.  Neurological:  Negative for dizziness, syncope, light-headedness, numbness and headaches.  Hematological:  Negative for adenopathy.  Psychiatric/Behavioral:  Negative for agitation, confusion, sleep disturbance and suicidal ideas. The patient is not nervous/anxious.      Objective: LMP 08/16/2016    Physical Exam Constitutional:      Appearance: She is well-developed.  Genitourinary:     Vulva normal.     Genitourinary Comments: UTERUS/CX SURG REM     Right Labia: No rash, tenderness or lesions.    Left Labia: No tenderness, lesions or rash.    Vaginal cuff intact.  No vaginal discharge, erythema or tenderness.      Right Adnexa: not tender and no mass present.    Left Adnexa: not tender and no mass present.    Cervix is absent.     Uterus is absent.  Breasts:    Right: No mass, nipple discharge, skin change or tenderness.     Left: No mass, nipple discharge, skin change or tenderness.  Neck:     Thyroid : No thyromegaly.  Cardiovascular:     Rate and Rhythm: Normal rate and regular rhythm.     Heart sounds: Normal heart sounds. No murmur heard. Pulmonary:     Effort: Pulmonary effort is normal.     Breath sounds: Normal breath sounds.  Abdominal:     Palpations: Abdomen is soft.     Tenderness: There is no abdominal tenderness. There is no guarding.  Musculoskeletal:        General: Normal range of motion.     Cervical back: Normal range of motion.  Neurological:     General: No focal deficit present.     Mental Status: She is alert and oriented to person, place, and time.     Cranial Nerves: No cranial nerve deficit.  Skin:    General: Skin is warm and dry.  Psychiatric:        Mood and Affect: Mood normal.        Behavior: Behavior normal.        Thought Content: Thought  content normal.        Judgment: Judgment normal.  Vitals reviewed.    Assessment/Plan: Encounter for annual routine gynecological examination  Encounter for screening mammogram for malignant neoplasm of breast - Plan: MM 3D SCREENING MAMMOGRAM BILATERAL BREAST, pt to schedule mammo  Hot flashes--controlled with estroven. F/u prn  Vaginal yeast infection - Plan: fluconazole  (DIFLUCAN ) 150 MG tablet; Rx RF prn sx.   Blood tests for routine general physical examination - Plan: CBC with Differential/Platelet, Comprehensive metabolic panel, Lipid panel  Screening cholesterol level - Plan: Lipid panel   No orders of the defined types were placed in this encounter.   GYN counsel mammography screening, adequate intake of calcium and vitamin D , diet and exercise     F/U  No follow-ups on file.  Jemell Town B. Tiffiny Worthy, PA-C 12/06/2024 10:50 AM "

## 2024-12-09 ENCOUNTER — Ambulatory Visit: Admitting: Obstetrics and Gynecology

## 2024-12-09 ENCOUNTER — Encounter: Payer: Self-pay | Admitting: Obstetrics and Gynecology

## 2024-12-09 VITALS — BP 121/76 | HR 70 | Ht 64.0 in | Wt 147.0 lb

## 2024-12-09 DIAGNOSIS — R1032 Left lower quadrant pain: Secondary | ICD-10-CM

## 2024-12-09 DIAGNOSIS — Z13220 Encounter for screening for lipoid disorders: Secondary | ICD-10-CM

## 2024-12-09 DIAGNOSIS — N941 Unspecified dyspareunia: Secondary | ICD-10-CM

## 2024-12-09 DIAGNOSIS — Z01419 Encounter for gynecological examination (general) (routine) without abnormal findings: Secondary | ICD-10-CM

## 2024-12-09 DIAGNOSIS — B3731 Acute candidiasis of vulva and vagina: Secondary | ICD-10-CM

## 2024-12-09 DIAGNOSIS — Z1151 Encounter for screening for human papillomavirus (HPV): Secondary | ICD-10-CM

## 2024-12-09 DIAGNOSIS — Z131 Encounter for screening for diabetes mellitus: Secondary | ICD-10-CM

## 2024-12-09 DIAGNOSIS — Z124 Encounter for screening for malignant neoplasm of cervix: Secondary | ICD-10-CM

## 2024-12-09 DIAGNOSIS — Z0000 Encounter for general adult medical examination without abnormal findings: Secondary | ICD-10-CM

## 2024-12-09 DIAGNOSIS — Z780 Asymptomatic menopausal state: Secondary | ICD-10-CM

## 2024-12-09 DIAGNOSIS — Z1231 Encounter for screening mammogram for malignant neoplasm of breast: Secondary | ICD-10-CM

## 2024-12-09 MED ORDER — ESTRADIOL 0.01 % VA CREA: TOPICAL_CREAM | VAGINAL | 1 refills | Status: AC

## 2024-12-09 NOTE — Patient Instructions (Addendum)
 I value your feedback and you entrusting [place] with your care. If you get a [facility] patient survey, I would appreciate you taking the time to let us know about your experience today. Thank you!  [facility] ([place])--[phone]

## 2024-12-22 ENCOUNTER — Other Ambulatory Visit
